# Patient Record
Sex: Female | Born: 1955 | Hispanic: No | Marital: Married | Smoking: Former smoker
Health system: Southern US, Community
[De-identification: ages and names within clinical notes are randomized; demographics above are authoritative.]

## PROBLEM LIST (undated history)

## (undated) ENCOUNTER — Emergency Department (HOSPITAL_COMMUNITY): Admission: EM | Payer: Self-pay | Source: Home / Self Care

## (undated) DIAGNOSIS — E0590 Thyrotoxicosis, unspecified without thyrotoxic crisis or storm: Secondary | ICD-10-CM

## (undated) DIAGNOSIS — J449 Chronic obstructive pulmonary disease, unspecified: Secondary | ICD-10-CM

## (undated) DIAGNOSIS — F419 Anxiety disorder, unspecified: Secondary | ICD-10-CM

## (undated) HISTORY — DX: Anxiety disorder, unspecified: F41.9

## (undated) HISTORY — PX: TUBAL LIGATION: SHX77

## (undated) HISTORY — PX: SKIN GRAFT: SHX250

---

## 2001-11-28 ENCOUNTER — Other Ambulatory Visit: Admission: RE | Admit: 2001-11-28 | Discharge: 2001-11-28 | Payer: Self-pay | Admitting: Dermatology

## 2021-03-29 ENCOUNTER — Other Ambulatory Visit: Payer: Self-pay

## 2021-03-29 ENCOUNTER — Encounter (HOSPITAL_COMMUNITY): Payer: Self-pay | Admitting: Emergency Medicine

## 2021-03-29 ENCOUNTER — Emergency Department (HOSPITAL_COMMUNITY): Payer: Medicare Other

## 2021-03-29 ENCOUNTER — Inpatient Hospital Stay (HOSPITAL_BASED_OUTPATIENT_CLINIC_OR_DEPARTMENT_OTHER): Payer: Medicare Other

## 2021-03-29 ENCOUNTER — Observation Stay (HOSPITAL_COMMUNITY)
Admission: EM | Admit: 2021-03-29 | Discharge: 2021-03-30 | Disposition: A | Discharge status: Home / Self Care | Payer: Medicare Other | Attending: Internal Medicine | Admitting: Internal Medicine

## 2021-03-29 DIAGNOSIS — J449 Chronic obstructive pulmonary disease, unspecified: Secondary | ICD-10-CM | POA: Diagnosis present

## 2021-03-29 DIAGNOSIS — Z20822 Contact with and (suspected) exposure to covid-19: Secondary | ICD-10-CM | POA: Diagnosis not present

## 2021-03-29 DIAGNOSIS — R0602 Shortness of breath: Secondary | ICD-10-CM | POA: Diagnosis not present

## 2021-03-29 DIAGNOSIS — E0590 Thyrotoxicosis, unspecified without thyrotoxic crisis or storm: Secondary | ICD-10-CM | POA: Diagnosis not present

## 2021-03-29 DIAGNOSIS — R1013 Epigastric pain: Secondary | ICD-10-CM | POA: Diagnosis not present

## 2021-03-29 DIAGNOSIS — Z87891 Personal history of nicotine dependence: Secondary | ICD-10-CM | POA: Insufficient documentation

## 2021-03-29 DIAGNOSIS — E039 Hypothyroidism, unspecified: Secondary | ICD-10-CM | POA: Diagnosis not present

## 2021-03-29 DIAGNOSIS — Z7901 Long term (current) use of anticoagulants: Secondary | ICD-10-CM | POA: Insufficient documentation

## 2021-03-29 DIAGNOSIS — R059 Cough, unspecified: Secondary | ICD-10-CM | POA: Insufficient documentation

## 2021-03-29 DIAGNOSIS — I4891 Unspecified atrial fibrillation: Secondary | ICD-10-CM

## 2021-03-29 DIAGNOSIS — Z720 Tobacco use: Secondary | ICD-10-CM

## 2021-03-29 DIAGNOSIS — R002 Palpitations: Secondary | ICD-10-CM | POA: Diagnosis present

## 2021-03-29 DIAGNOSIS — Z8719 Personal history of other diseases of the digestive system: Secondary | ICD-10-CM

## 2021-03-29 HISTORY — DX: Chronic obstructive pulmonary disease, unspecified: J44.9

## 2021-03-29 HISTORY — DX: Thyrotoxicosis, unspecified without thyrotoxic crisis or storm: E05.90

## 2021-03-29 LAB — CBC
HCT: 31.2 % — ABNORMAL LOW (ref 36.0–46.0)
Hemoglobin: 10.1 g/dL — ABNORMAL LOW (ref 12.0–15.0)
MCH: 28.9 pg (ref 26.0–34.0)
MCHC: 32.4 g/dL (ref 30.0–36.0)
MCV: 89.1 fL (ref 80.0–100.0)
Platelets: 250 10*3/uL (ref 150–400)
RBC: 3.5 MIL/uL — ABNORMAL LOW (ref 3.87–5.11)
RDW: 14.2 % (ref 11.5–15.5)
WBC: 5.5 10*3/uL (ref 4.0–10.5)
nRBC: 0 % (ref 0.0–0.2)

## 2021-03-29 LAB — ECHOCARDIOGRAM COMPLETE
AR max vel: 2.24 cm2
AV Area VTI: 1.97 cm2
AV Area mean vel: 2.03 cm2
AV Mean grad: 8 mmHg
AV Peak grad: 16.2 mmHg
Ao pk vel: 2.01 m/s
Area-P 1/2: 3.24 cm2
Calc EF: 63 %
Height: 63 in
MV VTI: 2.15 cm2
S' Lateral: 2.4 cm
Single Plane A2C EF: 72.1 %
Single Plane A4C EF: 57.5 %
Weight: 2130.53 oz

## 2021-03-29 LAB — MAGNESIUM: Magnesium: 1.8 mg/dL (ref 1.7–2.4)

## 2021-03-29 LAB — COMPREHENSIVE METABOLIC PANEL
ALT: 23 U/L (ref 0–44)
AST: 27 U/L (ref 15–41)
Albumin: 3.4 g/dL — ABNORMAL LOW (ref 3.5–5.0)
Alkaline Phosphatase: 171 U/L — ABNORMAL HIGH (ref 38–126)
Anion gap: 9 (ref 5–15)
BUN: 13 mg/dL (ref 8–23)
CO2: 24 mmol/L (ref 22–32)
Calcium: 9.3 mg/dL (ref 8.9–10.3)
Chloride: 107 mmol/L (ref 98–111)
Creatinine, Ser: 0.52 mg/dL (ref 0.44–1.00)
GFR, Estimated: >60 mL/min (ref >60)
Glucose, Bld: 142 mg/dL — ABNORMAL HIGH (ref 70–99)
Potassium: 3.5 mmol/L (ref 3.5–5.1)
Sodium: 140 mmol/L (ref 135–145)
Total Bilirubin: 0.7 mg/dL (ref 0.3–1.2)
Total Protein: 6.3 g/dL — ABNORMAL LOW (ref 6.5–8.1)

## 2021-03-29 LAB — RESP PANEL BY RT-PCR (FLU A&B, COVID) ARPGX2
Influenza A by PCR: NEGATIVE
Influenza B by PCR: NEGATIVE
SARS Coronavirus 2 by RT PCR: NEGATIVE

## 2021-03-29 LAB — CBG MONITORING, ED: Glucose-Capillary: 139 mg/dL — ABNORMAL HIGH (ref 70–99)

## 2021-03-29 LAB — T4, FREE: Free T4: 4.79 ng/dL — ABNORMAL HIGH (ref 0.61–1.12)

## 2021-03-29 LAB — BRAIN NATRIURETIC PEPTIDE: B Natriuretic Peptide: 628 pg/mL — ABNORMAL HIGH (ref 0.0–100.0)

## 2021-03-29 LAB — TROPONIN I (HIGH SENSITIVITY)
Troponin I (High Sensitivity): 12 ng/L (ref <18)
Troponin I (High Sensitivity): 12 ng/L (ref <18)

## 2021-03-29 LAB — MRSA NEXT GEN BY PCR, NASAL: MRSA by PCR Next Gen: NOT DETECTED

## 2021-03-29 LAB — TSH: TSH: <0.01 u[IU]/mL — ABNORMAL LOW (ref 0.350–4.500)

## 2021-03-29 MED ORDER — LACTATED RINGERS IV BOLUS
500.0000 mL | Freq: Once | INTRAVENOUS | Status: AC
  Administered 2021-03-29: 500 mL via INTRAVENOUS

## 2021-03-29 MED ORDER — APIXABAN 5 MG PO TABS
5.0000 mg | ORAL_TABLET | Freq: Two times a day (BID) | ORAL | Status: DC
  Administered 2021-03-29 – 2021-03-30 (×3): 5 mg via ORAL
  Filled 2021-03-29 (×3): qty 1

## 2021-03-29 MED ORDER — DILTIAZEM HCL-DEXTROSE 125-5 MG/125ML-% IV SOLN (PREMIX)
5.0000 mg/h | INTRAVENOUS | Status: DC
  Administered 2021-03-29: 5 mg/h via INTRAVENOUS
  Filled 2021-03-29: qty 125

## 2021-03-29 MED ORDER — CHLORHEXIDINE GLUCONATE CLOTH 2 % EX PADS
6.0000 | MEDICATED_PAD | Freq: Every day | CUTANEOUS | Status: DC
  Administered 2021-03-30: 6 via TOPICAL

## 2021-03-29 MED ORDER — POLYETHYLENE GLYCOL 3350 17 G PO PACK: 17.0000 g | PACK | Freq: Every day | ORAL | Status: DC | PRN

## 2021-03-29 MED ORDER — MAGNESIUM OXIDE -MG SUPPLEMENT 400 (240 MG) MG PO TABS
800.0000 mg | ORAL_TABLET | Freq: Once | ORAL | Status: AC
  Administered 2021-03-29: 800 mg via ORAL
  Filled 2021-03-29: qty 2

## 2021-03-29 MED ORDER — ASPIRIN 81 MG PO CHEW
324.0000 mg | CHEWABLE_TABLET | Freq: Once | ORAL | Status: AC
  Administered 2021-03-29: 324 mg via ORAL
  Filled 2021-03-29: qty 4

## 2021-03-29 MED ORDER — DOCUSATE SODIUM 100 MG PO CAPS: 100.0000 mg | ORAL_CAPSULE | Freq: Two times a day (BID) | ORAL | Status: DC | PRN

## 2021-03-29 MED ORDER — ALBUTEROL SULFATE (2.5 MG/3ML) 0.083% IN NEBU: 2.5000 mg | INHALATION_SOLUTION | RESPIRATORY_TRACT | Status: DC | PRN

## 2021-03-29 MED ORDER — DILTIAZEM HCL 60 MG PO TABS
60.0000 mg | ORAL_TABLET | Freq: Three times a day (TID) | ORAL | Status: DC
  Administered 2021-03-29 (×2): 60 mg via ORAL
  Filled 2021-03-29 (×2): qty 1

## 2021-03-29 MED ORDER — GUAIFENESIN-DM 100-10 MG/5ML PO SYRP
5.0000 mL | ORAL_SOLUTION | ORAL | Status: DC | PRN
  Filled 2021-03-29: qty 5

## 2021-03-29 MED ORDER — ALBUTEROL SULFATE HFA 108 (90 BASE) MCG/ACT IN AERS: 2.0000 | INHALATION_SPRAY | RESPIRATORY_TRACT | Status: DC | PRN

## 2021-03-29 MED ORDER — ACETAMINOPHEN 325 MG PO TABS: 650.0000 mg | ORAL_TABLET | ORAL | Status: DC | PRN

## 2021-03-29 MED ORDER — DILTIAZEM LOAD VIA INFUSION
10.0000 mg | Freq: Once | INTRAVENOUS | Status: AC
  Administered 2021-03-29: 10 mg via INTRAVENOUS
  Filled 2021-03-29: qty 10

## 2021-03-29 MED ORDER — POTASSIUM CHLORIDE CRYS ER 20 MEQ PO TBCR
40.0000 meq | EXTENDED_RELEASE_TABLET | Freq: Once | ORAL | Status: AC
Start: 2021-03-29 — End: 2021-03-29
  Administered 2021-03-29: 40 meq via ORAL
  Filled 2021-03-29: qty 2

## 2021-03-29 MED ORDER — ONDANSETRON HCL 4 MG/2ML IJ SOLN: 4.0000 mg | Freq: Four times a day (QID) | INTRAMUSCULAR | Status: DC | PRN

## 2021-03-29 NOTE — H&P (Signed)
History and Physical    Patient: Krista Ramirez MOL:[id number] DOB: Mar 22, 1955 DOA: 03/29/2021 DOS: the patient was seen and examined on 03/29/2021 PCP: Pcp, No  Patient coming from: Home  Chief Complaint:  Chief Complaint  Patient presents with   Shortness of Breath    HPI: Krista Ramirez is a 66 y.o. female with medical history significant of hypothyroidism, tobacco use recently quit smoking 5 weeks ago who presents with shortness of breath.  She states that since she quit smoking, she has had worsening productive cough.  She also admits to some epigastric pain with swallowing food.  She had an esophageal stricture decades ago which needed endoscopy and dilatation.  Otherwise denies any chest pain.  In the emergency department, she was found to be in A-fib RVR with heart rate in the 150s.  She was started on IV Cardizem and converted to normal sinus rhythm.  Review of Systems: As mentioned in the history of present illness. All other systems reviewed and are negative. Past Medical History:  Diagnosis Date   COPD (chronic obstructive pulmonary disease) (HCC)    Hyperthyroidism    Past Surgical History:  Procedure Laterality Date   SKIN GRAFT     TUBAL LIGATION     Social History:  reports that she quit smoking about 5 weeks ago. Her smoking use included cigarettes. She has a 30.00 pack-year smoking history. She has never used smokeless tobacco. She reports that she does not currently use alcohol. She reports that she does not currently use drugs.  No Known Allergies  History reviewed. No pertinent family history.  Prior to Admission medications   Medication Sig Start Date End Date Taking? Authorizing Provider  dextromethorphan-guaiFENesin (MUCINEX DM) 30-600 MG 12hr tablet Take 1 tablet by mouth 2 (two) times daily as needed for cough.   Yes [provider]    Physical Exam: Vitals:   03/29/21 1053 03/29/21 1100 03/29/21 1137 03/29/21 1141  BP:  122/68    Pulse:  79 73  65  Resp: (!) 23 (!) 22  (!) 26  Temp:      TempSrc:      SpO2: 100% 99%  99%  Weight:   60.4 kg   Height:   5\' 3"  (1.6 m)     Examination: General exam: Appears calm and comfortable  Respiratory system: Clear to auscultation. Respiratory effort normal. Cardiovascular system: S1 & S2 heard, RRR. No pedal edema. Gastrointestinal system: Abdomen is nondistended, soft and nontender. Normal bowel sounds heard. Central nervous system: Alert and oriented. Non focal exam. Speech clear  Extremities: Symmetric in appearance bilaterally  Skin: No rashes, lesions or ulcers on exposed skin  Psychiatry: Judgement and insight appear stable. Mood & affect appropriate.    Data Reviewed:  BMP largely unremarkable, BNP 628, troponin 12, TSH <0.010, influenza and COVID-negative  Assessment and Plan: * Atrial fibrillation with RVR (HCC)- (present on admission) Cardizem gtt Cardiology consulted Eliquis Echo ordered   Hyperthyroidism States she was started on methimazole about 3 years ago, stopped 6 months ago TSH <0.010 Check free T4  She will need to be restarted on methimazole, follow up with endocrinology   Tobacco abuse Congratulated on her quitting smoking 5 weeks ago       Advance Care Planning:   Code Status: Not on file full code  Consults: Cardiology  Family Communication: At bedside  Severity of Illness: The appropriate patient status for this patient is INPATIENT. Inpatient status is judged to be reasonable and necessary  in order to provide the required intensity of service to ensure the patient's safety. The patient's presenting symptoms, physical exam findings, and initial radiographic and laboratory data in the context of their chronic comorbidities is felt to place them at high Ramirez for further clinical deterioration. Furthermore, it is not anticipated that the patient will be medically stable for discharge from the hospital within 2 midnights of admission.   *  I certify that at the point of admission it is my clinical judgment that the patient will require inpatient hospital care spanning beyond 2 midnights from the point of admission due to high intensity of service, high Ramirez for further deterioration and high frequency of surveillance required.*  Author: Noralee Stain, DO 03/29/2021 11:44 AM  For on call review [email or web address].

## 2021-03-29 NOTE — Assessment & Plan Note (Addendum)
States she was started on methimazole about 3 years ago, stopped 6 months ago TSH <0.010 T4 4.79, T3 26.5 Started on methimazole She will need to follow-up with endocrinology referral

## 2021-03-29 NOTE — ED Provider Notes (Signed)
[facility] EMERGENCY DEPARTMENT Provider Note   CSN: [id number] Arrival date & time: 03/29/21  0741     History  Chief Complaint  Patient presents with   Shortness of Breath    Krista Ramirez is a 66 y.o. female.  HPI Patient presents for chest pain, palpitations, and shortness of breath.  She has no known medical problems.  Years ago, she had a thyroid disorder and previously took thyroid medication.  She no longer takes this.  She does not take any daily medications.  5 weeks ago she quit smoking.  Since that time, she has had a chronic cough that is worse at night.  Over the past week, she has experienced palpitations, shortness of breath, and exercise intolerance.  Over the past 4 days, she has experienced a substernal lower chest pain.  Chest pain is worsened with eating.  She has not noticed that the chest pain is worse with exertion but her shortness of breath certainly is.  She has no known cardiac history.  She denies nausea but does state that she has had some episodes of posttussive emesis. HPI: A 66 year old patient presents for evaluation of chest pain. Initial onset of pain was more than 6 hours ago. The patient's chest pain is described as heaviness/pressure/tightness and is worse with exertion. The patient's chest pain is not middle- or left-sided, is not well-localized, is not sharp and does not radiate to the arms/jaw/neck. The patient does not complain of nausea and denies diaphoresis. The patient has smoked in the past 90 days. The patient has no history of stroke, has no history of peripheral artery disease, denies any history of treated diabetes, has no relevant family history of coronary artery disease (first degree relative at less than age 11), is not hypertensive, has no history of hypercholesterolemia and does not have an elevated BMI (>=30).   Home Medications Prior to Admission medications   Medication Sig Start Date End Date Taking? Authorizing Provider   dextromethorphan-guaiFENesin (MUCINEX DM) 30-600 MG 12hr tablet Take 1 tablet by mouth 2 (two) times daily as needed for cough.   Yes [provider]      Allergies    Patient has no known allergies.    Review of Systems   Review of Systems  Constitutional:  Positive for activity change, appetite change and fatigue.  Respiratory:  Positive for cough and shortness of breath.   Cardiovascular:  Positive for chest pain and palpitations.  Gastrointestinal:  Positive for vomiting (posttussive).  All other systems reviewed and are negative.  Physical Exam Updated Vital Signs BP (!) 110/54    Pulse (!) 55    Temp 98 F (36.7 C) (Oral)    Resp 20    Ht 5\' 3"  (1.6 m)    Wt 60.4 kg    SpO2 94%    BMI 23.59 kg/m  Physical Exam Vitals and nursing note reviewed.  Constitutional:      General: She is not in acute distress.    Appearance: She is well-developed and normal weight. She is not ill-appearing, toxic-appearing or diaphoretic.  HENT:     Head: Normocephalic and atraumatic.     Mouth/Throat:     Mouth: Mucous membranes are moist.     Pharynx: Oropharynx is clear.  Eyes:     Extraocular Movements: Extraocular movements intact.     Conjunctiva/sclera: Conjunctivae normal.  Neck:     Vascular: JVD present.  Cardiovascular:     Rate and Rhythm: Tachycardia present. Rhythm  irregular.     Heart sounds: No murmur heard. Pulmonary:     Effort: Pulmonary effort is normal. No respiratory distress.     Breath sounds: Normal breath sounds. No decreased breath sounds, wheezing, rhonchi or rales.  Chest:     Chest wall: No mass or tenderness.  Abdominal:     Palpations: Abdomen is soft.     Tenderness: There is no abdominal tenderness.  Musculoskeletal:        General: No swelling.     Cervical back: Normal range of motion and neck supple.     Right lower leg: No edema.     Left lower leg: No edema.  Skin:    General: Skin is warm and dry.     Capillary Refill: Capillary  refill takes less than 2 seconds.     Coloration: Skin is not cyanotic or pale.  Neurological:     General: No focal deficit present.     Mental Status: She is alert and oriented to person, place, and time.     Cranial Nerves: No cranial nerve deficit.     Motor: No weakness.  Psychiatric:        Mood and Affect: Mood is anxious.        Behavior: Behavior normal. Behavior is not agitated.    ED Results / Procedures / Treatments   Labs (all labs ordered are listed, but only abnormal results are displayed) Labs Reviewed  CBC - Abnormal; Notable for the following components:      Result Value   RBC 3.50 (*)    Hemoglobin 10.1 (*)    HCT 31.2 (*)    All other components within normal limits  COMPREHENSIVE METABOLIC PANEL - Abnormal; Notable for the following components:   Glucose, Bld 142 (*)    Total Protein 6.3 (*)    Albumin 3.4 (*)    Alkaline Phosphatase 171 (*)    All other components within normal limits  TSH - Abnormal; Notable for the following components:   TSH <0.010 (*)    All other components within normal limits  BRAIN NATRIURETIC PEPTIDE - Abnormal; Notable for the following components:   B Natriuretic Peptide 628.0 (*)    All other components within normal limits  CBG MONITORING, ED - Abnormal; Notable for the following components:   Glucose-Capillary 139 (*)    All other components within normal limits  RESP PANEL BY RT-PCR (FLU A&B, COVID) ARPGX2  MRSA NEXT GEN BY PCR, NASAL  MAGNESIUM  T4, FREE  T3, FREE  HIV ANTIBODY (ROUTINE TESTING W REFLEX)  CBC  BASIC METABOLIC PANEL  MAGNESIUM  TROPONIN I (HIGH SENSITIVITY)  TROPONIN I (HIGH SENSITIVITY)    EKG EKG Interpretation  Date/Time:  Monday March 29 2021 08:31:15 EST Ventricular Rate:  136 PR Interval:    QRS Duration: 88 QT Interval:  298 QTC Calculation: 465 R Axis:   45 Text Interpretation: Atrial fibrillation Ventricular premature complex Aberrant conduction of SV complex(es) Confirmed  by Godfrey Pick ([id number]) on 03/29/2021 9:12:55 AM  Radiology DG Chest Port 1 View  Result Date: 03/29/2021 CLINICAL DATA:  Shortness of breath, new onset atrial fibrillation EXAM: PORTABLE CHEST 1 VIEW COMPARISON:  Portable exam 0845 hours without priors for comparison FINDINGS: Upper normal heart size. Mediastinal contours and pulmonary vascularity normal. Atherosclerotic calcification aorta. Lungs clear. No infiltrate, pleural effusion, or pneumothorax. Osseous structures unremarkable. IMPRESSION: No acute abnormalities. Aortic Atherosclerosis (ICD10-I70.0). Electronically Signed   By: Crist Infante.D.  On: 03/29/2021 08:49   ECHOCARDIOGRAM COMPLETE  Result Date: 03/29/2021    ECHOCARDIOGRAM REPORT   Patient Name:   AMARYLIS DONAN Date of Exam: 03/29/2021 Medical Rec #:  [id number]      Height:       63.0 in Accession #:    [id number]     Weight:       133.2 lb Date of Birth:  April 09, 1955      BSA:          1.627 m Patient Age:    65 years       BP:           122/68 mmHg Patient Gender: F              HR:           70 bpm. [facility] Procedure: 2D Echo, Cardiac Doppler and Color Doppler Indications:    Atrial fibrillation  History:        Patient has no prior history of Echocardiogram examinations.                 COPD; Arrythmias:Atrial Fibrillation.  Sonographer:    Mikki Harbor Referring Phys: [id number] MICHELE M LENZE IMPRESSIONS  1. Left ventricular ejection fraction, by estimation, is 60 to 65%. The left ventricle has normal function. The left ventricle has no regional wall motion abnormalities. Left ventricular diastolic parameters are indeterminate.  2. Right ventricular systolic function is normal. The right ventricular size is normal. There is normal pulmonary artery systolic pressure.  3. Left atrial size was mildly dilated.  4. The mitral valve is normal in structure. Mild mitral valve regurgitation. No evidence of mitral stenosis.  5. The aortic valve is tricuspid. Aortic valve  regurgitation is mild. No aortic stenosis is present.  6. The inferior vena cava is normal in size with greater than 50% respiratory variability, suggesting right atrial pressure of 3 mmHg. FINDINGS  Left Ventricle: Left ventricular ejection fraction, by estimation, is 60 to 65%. The left ventricle has normal function. The left ventricle has no regional wall motion abnormalities. The left ventricular internal cavity size was normal in size. There is  no left ventricular hypertrophy. Left ventricular diastolic parameters are indeterminate. Right Ventricle: The right ventricular size is normal. No increase in right ventricular wall thickness. Right ventricular systolic function is normal. There is normal pulmonary artery systolic pressure. The tricuspid regurgitant velocity is 2.55 m/s, and  with an assumed right atrial pressure of 3 mmHg, the estimated right ventricular systolic pressure is 29.0 mmHg. Left Atrium: Left atrial size was mildly dilated. Right Atrium: Right atrial size was normal in size. Pericardium: There is no evidence of pericardial effusion. Mitral Valve: The mitral valve is normal in structure. Mild mitral valve regurgitation. No evidence of mitral valve stenosis. MV peak gradient, 6.0 mmHg. The mean mitral valve gradient is 2.0 mmHg. Tricuspid Valve: The tricuspid valve is normal in structure. Tricuspid valve regurgitation is mild . No evidence of tricuspid stenosis. Aortic Valve: The aortic valve is tricuspid. Aortic valve regurgitation is mild. No aortic stenosis is present. Aortic valve mean gradient measures 8.0 mmHg. Aortic valve peak gradient measures 16.2 mmHg. Aortic valve area, by VTI measures 1.97 cm. Pulmonic Valve: The pulmonic valve was not well visualized. Pulmonic valve regurgitation is mild. No evidence of pulmonic stenosis. Aorta: The aortic root is normal in size and structure. Venous: The inferior vena cava is normal in size with greater than 50% respiratory variability,  suggesting right atrial pressure of 3 mmHg. IAS/Shunts: No atrial level shunt detected by color flow Doppler.  LEFT VENTRICLE PLAX 2D LVIDd:         4.30 cm     Diastology LVIDs:         2.40 cm     LV e' medial:    7.18 cm/s LV PW:         1.00 cm     LV E/e' medial:  17.4 LV IVS:        1.00 cm     LV e' lateral:   10.60 cm/s LVOT diam:     2.00 cm     LV E/e' lateral: 11.8 LV SV:         82 LV SV Index:   50 LVOT Area:     3.14 cm  LV Volumes (MOD) LV vol d, MOD A2C: 70.5 ml LV vol d, MOD A4C: 67.8 ml LV vol s, MOD A2C: 19.7 ml LV vol s, MOD A4C: 28.8 ml LV SV MOD A2C:     50.8 ml LV SV MOD A4C:     67.8 ml LV SV MOD BP:      43.9 ml RIGHT VENTRICLE RV Basal diam:  3.50 cm RV Mid diam:    2.80 cm RV S prime:     14.80 cm/s TAPSE (M-mode): 3.3 cm LEFT ATRIUM             Index        RIGHT ATRIUM           Index LA diam:        3.60 cm 2.21 cm/m   RA Area:     21.20 cm LA Vol (A2C):   56.6 ml 34.80 ml/m  RA Volume:   65.00 ml  39.96 ml/m LA Vol (A4C):   65.9 ml 40.51 ml/m LA Biplane Vol: 64.6 ml 39.71 ml/m  AORTIC VALVE                     PULMONIC VALVE AV Area (Vmax):    2.24 cm      PV Vmax:       0.95 m/s AV Area (Vmean):   2.03 cm      PV Peak grad:  3.6 mmHg AV Area (VTI):     1.97 cm AV Vmax:           201.00 cm/s AV Vmean:          128.000 cm/s AV VTI:            0.414 m AV Peak Grad:      16.2 mmHg AV Mean Grad:      8.0 mmHg LVOT Vmax:         143.00 cm/s LVOT Vmean:        82.600 cm/s LVOT VTI:          0.260 m LVOT/AV VTI ratio: 0.63  AORTA Ao Root diam: 2.80 cm MITRAL VALVE                TRICUSPID VALVE MV Area (PHT): 3.24 cm     TR Peak grad:   26.0 mmHg MV Area VTI:   2.15 cm     TR Vmax:        255.00 cm/s MV Peak grad:  6.0 mmHg MV Mean grad:  2.0 mmHg     SHUNTS MV Vmax:       1.22 m/s  Systemic VTI:  0.26 m MV Vmean:      72.2 cm/s    Systemic Diam: 2.00 cm MV Decel Time: 234 msec MV E velocity: 125.00 cm/s MV A velocity: 82.90 cm/s MV E/A ratio:  1.51 Carlyle Dolly MD  Electronically signed by Carlyle Dolly MD Signature Date/Time: 03/29/2021/1:12:32 PM    Final     Procedures Procedures    Medications Ordered in ED Medications  diltiazem (CARDIZEM) 1 mg/mL load via infusion 10 mg (10 mg Intravenous Bolus from Bag 03/29/21 0856)    And  diltiazem (CARDIZEM) 125 mg in dextrose 5% 125 mL (1 mg/mL) infusion (5 mg/hr Intravenous Infusion Verify 03/29/21 1541)  apixaban (ELIQUIS) tablet 5 mg (5 mg Oral Given 03/29/21 0902)  albuterol (PROVENTIL) (2.5 MG/3ML) 0.083% nebulizer solution 2.5 mg (has no administration in time range)  Chlorhexidine Gluconate Cloth 2 % PADS 6 each (has no administration in time range)  docusate sodium (COLACE) capsule 100 mg (has no administration in time range)  polyethylene glycol (MIRALAX / GLYCOLAX) packet 17 g (has no administration in time range)  acetaminophen (TYLENOL) tablet 650 mg (has no administration in time range)  ondansetron (ZOFRAN) injection 4 mg (has no administration in time range)  diltiazem (CARDIZEM) tablet 60 mg (60 mg Oral Given 03/29/21 1300)  aspirin chewable tablet 324 mg (324 mg Oral Given 03/29/21 0825)  lactated ringers bolus 500 mL (0 mLs Intravenous Stopped 03/29/21 0915)  magnesium oxide (MAG-OX) tablet 800 mg (800 mg Oral Given 03/29/21 0923)  potassium chloride SA (KLOR-CON M) CR tablet 40 mEq (40 mEq Oral Given 03/29/21 [id number])    ED Course/ Medical Decision Making/ A&P   HEAR Score: 5                       Medical Decision Making Amount and/or Complexity of Data Reviewed Labs: ordered.  Risk OTC drugs. Prescription drug management. Decision regarding hospitalization.   This patient presents to the ED for concern of chest pain, palpitations, and shortness of breath, this involves an extensive number of treatment options, and is a complaint that carries with it a high risk of complications and morbidity.  The differential diagnosis includes atrial fibrillation, atrial flutter, CHF, ACS,  pneumonia, PE   Co morbidities that complicate the patient evaluation  Unknown   Additional history obtained:  Additional history obtained from patient's significant other External records from outside source obtained and reviewed including EMR   Lab Tests:  I Ordered, and personally interpreted labs.  The pertinent results include: Borderline low potassium and magnesium, normocytic anemia with unknown baseline hemoglobin, elevated BNP, normal troponin.   Imaging Studies ordered:  I ordered imaging studies including chest x-ray I independently visualized and interpreted imaging which showed no acute findings I agree with the radiologist interpretation   Cardiac Monitoring:  The patient was maintained on a cardiac monitor.  I personally viewed and interpreted the cardiac monitored which showed an underlying rhythm of: Atrial fibrillation   Medicines ordered and prescription drug management:  I ordered medication including Cardizem for rate control Reevaluation of the patient after these medicines showed that the patient improved I have reviewed the patients home medicines and have made adjustments as needed   Test Considered:  CTA chest, patient could have chest pain or shortness of breath from a PE, however, does appear that symptoms are related to her atrial fibrillation.   Critical Interventions:  Cardizem for rate control, initiation of Eliquis, admission to hospital  Consultations Obtained:  I requested consultation with the cardiology,  and discussed lab and imaging findings as well as pertinent plan - they recommend: Admission.  They will follow in consult.   Problem List / ED Course:  Very pleasant 66 year old female who does not have any known chronic medical conditions, but also does not see outpatient doctors.  She presents to the emergency department for 4 to 7 days of palpitations, shortness of breath, exercise intolerance, and intermittent  substernal chest pain.  On arrival in the ED, she is found to be in atrial fibrillation with RVR.  This will be a new diagnosis.  Lab work was obtained to identify underlying causes of her new A-fib.  Patient had grossly normal LVEF on bedside ultrasound.  Cardizem bolus and gtt. were ordered.  Lab work showed potassium and magnesium levels that were at the low end of normal.  Replacement electrolytes were given for optimization.  Patient was started on Eliquis.  On reassessment, patient had improved heart rate and subjectively improved symptoms following 10 mg bolus of Cardizem and a gtt running at 5 mg/h.  I did speak with cardiology who agrees with current management and recommends admission.  They will follow in consultation.  Based on paucity of previous testing, in addition to her new to atrial fibrillation, I do feel the patient would benefit from admission to hospital for continued rate control, monitoring, and further testing.     Reevaluation:  After the interventions noted above, I reevaluated the patient and found that they have :improved   Social Determinants of Health:  Does not see doctors   Dispostion:  After consideration of the diagnostic results and the patients response to treatment, I feel that the patent would benefit from admission.   CRITICAL CARE Performed by: Godfrey Pick   Total critical care time: 35 minutes  Critical care time was exclusive of separately billable procedures and treating other patients.  Critical care was necessary to treat or prevent imminent or life-threatening deterioration.  Critical care was time spent personally by me on the following activities: development of treatment plan with patient and/or surrogate as well as nursing, discussions with consultants, evaluation of patient's response to treatment, examination of patient, obtaining history from patient or surrogate, ordering and performing treatments and interventions, ordering and review  of laboratory studies, ordering and review of radiographic studies, pulse oximetry and re-evaluation of patient's condition.         Final Clinical Impression(s) / ED Diagnoses Final diagnoses:  Atrial fibrillation with RVR [facility])    Rx / DC Orders ED Discharge Orders          Ordered    Amb referral to AFIB Clinic        03/29/21 0835              Godfrey Pick, MD 03/29/21 1758

## 2021-03-29 NOTE — ED Triage Notes (Signed)
Pt states she quit smoking 5 wks ago and has increased shortness of breath with coughing.

## 2021-03-29 NOTE — Assessment & Plan Note (Signed)
Congratulated on her quitting smoking 5 weeks ago

## 2021-03-29 NOTE — Consult Note (Addendum)
Cardiology Consultation:   Patient ID: Saul Conrado MRN: [id number]; DOB: 17-Aug-1955  Admit date: 03/29/2021 Date of Consult: 03/29/2021  PCP:  Merryl Hacker, No   [facility] Providers Cardiologist:  None        Patient Profile:   Jasiri Sandin is a 66 y.o. female with a hx of hyperthyroidism who is being seen 03/29/2021 for the evaluation of new Afib at the request of Dr. Maylene Roes.  History of Present Illness:   Ms. Highsmith is a 66 yo female with history of hyperthyroidism off meds for 6 months. She quit smoking 5 weeks ago cold turkey(1ppd) and developed a terrible cough, palpitations, dyspnea with little activity, chest pressure with activity and fast heart rates. On arrival noted to be in Afib with RVR and now on IV diltiazem and eliquis. TSH low 0.010, BNP 628, Troponin normal, Hgb 10.1.  Denies any history of HTN, DM, family history of CAD.   Past Medical History:  Diagnosis Date   COPD (chronic obstructive pulmonary disease) ([place])    Hypothyroid     Past Surgical History:  Procedure Laterality Date   SKIN GRAFT     TUBAL LIGATION       Home Medications:  Prior to Admission medications   Not on File    Inpatient Medications: Scheduled Meds:  apixaban  5 mg Oral BID   Continuous Infusions:  diltiazem (CARDIZEM) infusion 5 mg/hr (03/29/21 0856)   PRN Meds: albuterol  Allergies:   No Known Allergies  Social History:   Social History   Socioeconomic History   Marital status: Married    Spouse name: Not on file   Number of children: Not on file   Years of education: Not on file   Highest education level: Not on file  Occupational History   Not on file  Tobacco Use   Smoking status: Former    Packs/day: 1.00    Years: 30.00    Pack years: 30.00    Types: Cigarettes    Quit date: 02/22/2021    Years since quitting: 0.0   Smokeless tobacco: Never  Vaping Use   Vaping Use: Never used  Substance and Sexual Activity   Alcohol use: Not Currently   Drug use:  Not Currently   Sexual activity: Not on file  Other Topics Concern   Not on file  Social History Narrative   Not on file   Social Determinants of Health   Financial Resource Strain: Not on file  Food Insecurity: Not on file  Transportation Needs: Not on file  Physical Activity: Not on file  Stress: Not on file  Social Connections: Not on file  Intimate Partner Violence: Not on file    Family History:    History reviewed. No pertinent family history.   ROS:  Please see the history of present illness.  Review of Systems  Constitutional: Negative.  HENT: Negative.    Eyes: Negative.   Cardiovascular:  Positive for chest pain, dyspnea on exertion, irregular heartbeat and palpitations.  Respiratory:  Positive for cough.   Hematologic/Lymphatic: Negative.   Musculoskeletal: Negative.  Negative for joint pain.  Gastrointestinal: Negative.   Genitourinary: Negative.   Neurological: Negative.    All other ROS reviewed and negative.     Physical Exam/Data:   Vitals:   03/29/21 0910 03/29/21 0920 03/29/21 0932 03/29/21 0945  BP: (!) 150/81 (!) 142/78    Pulse: 98 68 86 96  Resp: 16 (!) 22 14 (!) 22  Temp:  TempSrc:      SpO2: 98% 100% 97% 100%  Weight:      Height:        Intake/Output Summary (Last 24 hours) at 03/29/2021 1006 Last data filed at 03/29/2021 0915 Gross per 24 hour  Intake 250 ml  Output --  Net 250 ml   Last 3 Weights 03/29/2021  Weight (lbs) 140 lb  Weight (kg) 63.504 kg     Body mass index is 24.8 kg/m.  General:  Well nourished, well developed, in no acute distress  HEENT: normal Neck: no JVD Vascular: No carotid bruits; Distal pulses 2+ bilaterally Cardiac:  normal S1, S2; irreg irreg no murmur   Lungs:  decreased breath sounds but clear to auscultation bilaterally, no wheezing, rhonchi or rales  Abd: soft, nontender, no hepatomegaly  Ext: no edema Musculoskeletal:  No deformities, BUE and BLE strength normal and equal Skin: warm and  dry  Neuro:  CNs 2-12 intact, no focal abnormalities noted Psych:  Normal affect   EKG:  The EKG was personally reviewed and demonstrates:  Afib with RVR Telemetry:  Telemetry was personally reviewed and demonstrates:  Afib 105/m  Relevant CV Studies:    Laboratory Data:  High Sensitivity Troponin:   Recent Labs  Lab 03/29/21 0815  TROPONINIHS 12     Chemistry Recent Labs  Lab 03/29/21 0815  NA 140  K 3.5  CL 107  CO2 24  GLUCOSE 142*  BUN 13  CREATININE 0.52  CALCIUM 9.3  MG 1.8  GFRNONAA >60  ANIONGAP 9    Recent Labs  Lab 03/29/21 0815  PROT 6.3*  ALBUMIN 3.4*  AST 27  ALT 23  ALKPHOS 171*  BILITOT 0.7   Lipids No results for input(s): CHOL, TRIG, HDL, LABVLDL, LDLCALC, CHOLHDL in the last 168 hours.  Hematology Recent Labs  Lab 03/29/21 0815  WBC 5.5  RBC 3.50*  HGB 10.1*  HCT 31.2*  MCV 89.1  MCH 28.9  MCHC 32.4  RDW 14.2  PLT 250   Thyroid  Recent Labs  Lab 03/29/21 0816  TSH <0.010*    BNP Recent Labs  Lab 03/29/21 0817  BNP 628.0*    DDimer No results for input(s): DDIMER in the last 168 hours.   Radiology/Studies:  DG Chest Port 1 View  Result Date: 03/29/2021 CLINICAL DATA:  Shortness of breath, new onset atrial fibrillation EXAM: PORTABLE CHEST 1 VIEW COMPARISON:  Portable exam 0845 hours without priors for comparison FINDINGS: Upper normal heart size. Mediastinal contours and pulmonary vascularity normal. Atherosclerotic calcification aorta. Lungs clear. No infiltrate, pleural effusion, or pneumothorax. Osseous structures unremarkable. IMPRESSION: No acute abnormalities. Aortic Atherosclerosis (ICD10-I70.0). Electronically Signed   By: Lavonia Dana M.D.   On: 03/29/2021 08:49     Assessment and Plan:   Afib with RVR new onset on IV diltiazem and eliquis. Will need endocrine to see with hyperthyroidism untreated. Hopefully will convert but if not can plan DCCV after 3 weeks of eliquis and treated thyroid. CHADSVASC=2 for  age/sex. Will await echo.   Chest pain, palpitations dyspnea in the setting of rapid Afib troponin negative-check echo   Hyperthyroidism-off meds for 6 months, low TSH needs endocrine to see. Previously on methimazole   Tobacco abuse-quit smoking 5 weeks ago   Risk Assessment/Risk Scores:     HEAR Score (for undifferentiated chest pain):  HEAR Score: 5  New York Heart Association (NYHA) Functional Class NYHA Class II  CHA2DS2-VASc Score = 2   This indicates a  2.2% annual risk of stroke. The patient's score is based upon: CHF History: 0 HTN History: 0 Diabetes History: 0 Stroke History: 0 Vascular Disease History: 0 Age Score: 1 Gender Score: 1          For questions or updates, please contact [place] Please consult [email or web address] for contact info under    Signed, Ermalinda Barrios, PA-C  03/29/2021 10:06 AM  Attending note  Patient seen and discussed with PA Bonnell Public, I agree with her documentation. No known medical problems presents with palpitaitons, SOB, chest pain. In ER found to be in afib with RVR, new diagnosis for the patient. Started on diltiazem drip with resolution of symptoms with heart rate control.    WBC 5.5 Hgb 10.1 Plt 250 Mg 1.8 K 3.5 Cr 0.52 TSH <0.010 BNP 628 Trop 12 CXR no acute process EKG afib with RVR  1.Afib w/ RVR - new diagnosis this admission - started on dilt gtt, she has already converted back to NSR - start oral diltiazem 60mg  tid with hold parameters, wean dilt gtt to off - CHADS2Vasc score is at least 2(age, gender), started on eliquis 5mg  bid   2.Hyperthyroidism - significantly low TSH, free T4 pending - management per primary team   Carlyle Dolly MD

## 2021-03-29 NOTE — Assessment & Plan Note (Addendum)
Cardizem gtt --> p.o. Cardizem Appreciate cardiology, follow-up with them outpatient Eliquis

## 2021-03-29 NOTE — Progress Notes (Signed)
*  PRELIMINARY RESULTS* Echocardiogram 2D Echocardiogram has been performed.  Carolyne Fiscal 03/29/2021, 12:39 PM

## 2021-03-29 NOTE — Discharge Instructions (Signed)

## 2021-03-29 NOTE — ED Notes (Addendum)
Note in error.

## 2021-03-30 DIAGNOSIS — I4891 Unspecified atrial fibrillation: Secondary | ICD-10-CM | POA: Diagnosis not present

## 2021-03-30 DIAGNOSIS — Z8719 Personal history of other diseases of the digestive system: Secondary | ICD-10-CM

## 2021-03-30 DIAGNOSIS — E0590 Thyrotoxicosis, unspecified without thyrotoxic crisis or storm: Secondary | ICD-10-CM | POA: Diagnosis not present

## 2021-03-30 LAB — CBC
HCT: 29 % — ABNORMAL LOW (ref 36.0–46.0)
Hemoglobin: 8.8 g/dL — ABNORMAL LOW (ref 12.0–15.0)
MCH: 26.9 pg (ref 26.0–34.0)
MCHC: 30.3 g/dL (ref 30.0–36.0)
MCV: 88.7 fL (ref 80.0–100.0)
Platelets: 224 10*3/uL (ref 150–400)
RBC: 3.27 MIL/uL — ABNORMAL LOW (ref 3.87–5.11)
RDW: 14.2 % (ref 11.5–15.5)
WBC: 4.6 10*3/uL (ref 4.0–10.5)
nRBC: 0 % (ref 0.0–0.2)

## 2021-03-30 LAB — T3, FREE: T3, Free: 26.5 pg/mL — ABNORMAL HIGH (ref 2.0–4.4)

## 2021-03-30 LAB — BASIC METABOLIC PANEL
Anion gap: 9 (ref 5–15)
BUN: 28 mg/dL — ABNORMAL HIGH (ref 8–23)
CO2: 21 mmol/L — ABNORMAL LOW (ref 22–32)
Calcium: 9.3 mg/dL (ref 8.9–10.3)
Chloride: 110 mmol/L (ref 98–111)
Creatinine, Ser: 0.56 mg/dL (ref 0.44–1.00)
GFR, Estimated: >60 mL/min (ref >60)
Glucose, Bld: 105 mg/dL — ABNORMAL HIGH (ref 70–99)
Potassium: 4.4 mmol/L (ref 3.5–5.1)
Sodium: 140 mmol/L (ref 135–145)

## 2021-03-30 LAB — MAGNESIUM: Magnesium: 2 mg/dL (ref 1.7–2.4)

## 2021-03-30 LAB — HIV ANTIBODY (ROUTINE TESTING W REFLEX): HIV Screen 4th Generation wRfx: NONREACTIVE

## 2021-03-30 MED ORDER — METHIMAZOLE 10 MG PO TABS: 10.0000 mg | ORAL_TABLET | Freq: Two times a day (BID) | ORAL | 1 refills | Status: DC

## 2021-03-30 MED ORDER — METHIMAZOLE 5 MG PO TABS
10.0000 mg | ORAL_TABLET | Freq: Two times a day (BID) | ORAL | Status: DC
  Filled 2021-03-30 (×3): qty 2

## 2021-03-30 MED ORDER — DILTIAZEM HCL ER COATED BEADS 120 MG PO CP24: 120.0000 mg | ORAL_CAPSULE | Freq: Every day | ORAL | 1 refills | Status: DC

## 2021-03-30 MED ORDER — DILTIAZEM HCL ER COATED BEADS 120 MG PO CP24
120.0000 mg | ORAL_CAPSULE | Freq: Every day | ORAL | Status: DC
  Administered 2021-03-30: 120 mg via ORAL
  Filled 2021-03-30: qty 1

## 2021-03-30 MED ORDER — APIXABAN 5 MG PO TABS: 5.0000 mg | ORAL_TABLET | Freq: Two times a day (BID) | ORAL | 1 refills | Status: DC

## 2021-03-30 NOTE — Assessment & Plan Note (Signed)
States that she underwent EGD and dilatation many years ago, now having symptoms of food getting stuck in her chest She will need outpatient GI referral

## 2021-03-30 NOTE — Hospital Course (Signed)
Krista Ramirez is a 66 y.o. female with medical history significant of hypothyroidism, tobacco use recently quit smoking 5 weeks ago who presents with shortness of breath.  She states that since she quit smoking, she has had worsening productive cough.  She also admits to some epigastric pain with swallowing food.  She had an esophageal stricture decades ago which needed endoscopy and dilatation.  Otherwise denies any chest pain.   In the emergency department, she was found to be in A-fib RVR with heart rate in the 150s.  She was started on IV Cardizem and converted to normal sinus rhythm.  Patient was seen by cardiology, underwent echocardiogram. Her work up also revealed hyperthyroidism (states she stopped taking methimazole 6 months ago).  She remained stable on oral Cardizem and was discharged home in stable condition.

## 2021-03-30 NOTE — Care Management Obs Status (Signed)
MEDICARE OBSERVATION STATUS NOTIFICATION   Patient Details  Name: Krista Ramirez MRN: [id number] Date of Birth: 1955/07/12   Medicare Observation Status Notification Given:  Yes    Elliot Gault, LCSW 03/30/2021, 9:12 AM

## 2021-03-30 NOTE — TOC Transition Note (Addendum)
Transition of Care [facility]) - CM/SW Discharge Note   Patient Details  Name: Krista Ramirez MRN: [id number] Date of Birth: Dec 31, 1955  [facility] CM/SW Contact:  Shade Flood, LCSW Phone Number: 03/30/2021, 9:13 AM   Clinical Narrative:     Pt stable for dc today per MD. Met with pt to review [facility] consult for PCP and Medication assistance and PCP. Pt reports that she just recently became active with Medicare and she does not have Part D prescription assistance. She also does not have a PCP. Pt's husband is in the room and on the phone with the office of his PCP trying to get an appointment for pt. Pt is also now scheduled for follow up with a cardiologist. Pt is not eligible for any prescription assistance as she does have Medicare and elected not to enroll in Part D. Informed pt that they can ask at the pharmacy if there are any more of the 30 day free Eliquis coupons available. Pt informed that [facility] does not have any more available and TOC unsure if the program is still available or not. Pt and her husband state that they will work to find the prescriptions at the most affordable price.   At this time, there are no other TOC needs for dc.  1051: [facility] Pharmacist did have the 30 day free coupon for Eliquis and this was provided to pt by pharmacist.   Expected Discharge Plan: Home/Self Care Barriers to Discharge: Barriers Resolved   Patient Goals and CMS Choice Patient states their goals for this hospitalization and ongoing recovery are:: go home      Expected Discharge Plan and Services Expected Discharge Plan: Home/Self Care In-house Referral: Clinical Social Work     Living arrangements for the past 2 months: Single Family Home Expected Discharge Date: 03/30/21                                    Prior Living Arrangements/Services Living arrangements for the past 2 months: Single Family Home Lives with:: Spouse Patient language and need for  interpreter reviewed:: Yes Do you feel safe going back to the place where you live?: Yes      Need for Family Participation in Patient Care: No (Comment)     Criminal Activity/Legal Involvement Pertinent to Current Situation/Hospitalization: No - Comment as needed  Activities of Daily Living Home Assistive Devices/Equipment: None ADL Screening (condition at time of admission) Patient's cognitive ability adequate to safely complete daily activities?: Yes Is the patient deaf or have difficulty hearing?: No Does the patient have difficulty seeing, even when wearing glasses/contacts?: No Does the patient have difficulty concentrating, remembering, or making decisions?: No Patient able to express need for assistance with ADLs?: Yes Does the patient have difficulty dressing or bathing?: No Independently performs ADLs?: Yes (appropriate for developmental age) Does the patient have difficulty walking or climbing stairs?: No Weakness of Legs: None Weakness of Arms/Hands: None  Permission Sought/Granted                  Emotional Assessment Appearance:: Appears stated age Attitude/Demeanor/Rapport: Engaged Affect (typically observed): Pleasant Orientation: : Oriented to Self, Oriented to Place, Oriented to  Time, Oriented to Situation Alcohol / Substance Use: Not Applicable Psych Involvement: No (comment)  Admission diagnosis:  SOB (shortness of breath) [R06.02] Atrial fibrillation with RVR ([place]) [I48.91] Patient Active Problem List   Diagnosis Date  Noted   History of esophageal stricture 03/30/2021   Atrial fibrillation with RVR ([place]) 03/29/2021   Tobacco abuse 03/29/2021   COPD (chronic obstructive pulmonary disease) ([place])    Hyperthyroidism    PCP:  Pcp, No Pharmacy:   [facility] [place] [place] - [address] [place] [address] Phone: [phone] Fax: [phone]     Social Determinants of Health (SDOH) Interventions    Readmission Risk  Interventions No flowsheet data found.   Final next level of care: Home/Self Care Barriers to Discharge: Barriers Resolved   Patient Goals and CMS Choice Patient states their goals for this hospitalization and ongoing recovery are:: go home      Discharge Placement                       Discharge Plan and Services In-house Referral: Clinical Social Work                                   Social Determinants of Health (SDOH) Interventions     Readmission Risk Interventions No flowsheet data found.

## 2021-03-30 NOTE — Care Management CC44 (Signed)
Condition Code 44 Documentation Completed  Patient Details  Name: Krista Ramirez MRN: [id number] Date of Birth: May 26, 1955   Condition Code 44 given:  Yes Patient signature on Condition Code 44 notice:  Yes Documentation of 2 MD's agreement:  Yes Code 44 added to claim:  Yes    Shade Flood, LCSW 03/30/2021, 9:12 AM

## 2021-03-30 NOTE — Progress Notes (Signed)
D/c paperwork & instructions given to patient, IV catheter removed from RIGHT arm, patient changed & reported she has all of her belongings

## 2021-03-30 NOTE — Discharge Summary (Signed)
Physician Discharge Summary   Patient: Krista Ramirez MRN: [id number] DOB: Jan 28, 1956  Admit date:     03/29/2021  Discharge date: 03/30/21  Discharge Physician: Dessa Phi   PCP: Pcp, No   Recommendations at discharge:   Encouraged patient to establish with PCP as soon as possible. She will need endocrinology referral for hyperthyroidism and GI referral for history of esophageal stricture.  Follow up with Dr. Harl Bowie, cardiology  Discharge Diagnoses: Principal Problem:   Atrial fibrillation with RVR ([place]) Active Problems:   Hyperthyroidism   COPD (chronic obstructive pulmonary disease) (HCC)   Tobacco abuse   History of esophageal stricture  Resolved Problems:   * No resolved hospital problems. *   Hospital Course: Krista Ramirez is a 66 y.o. female with medical history significant of hypothyroidism, tobacco use recently quit smoking 5 weeks ago who presents with shortness of breath.  She states that since she quit smoking, she has had worsening productive cough.  She also admits to some epigastric pain with swallowing food.  She had an esophageal stricture decades ago which needed endoscopy and dilatation.  Otherwise denies any chest pain.   In the emergency department, she was found to be in A-fib RVR with heart rate in the 150s.  She was started on IV Cardizem and converted to normal sinus rhythm.  Patient was seen by cardiology, underwent echocardiogram. Her work up also revealed hyperthyroidism (states she stopped taking methimazole 6 months ago).  She remained stable on oral Cardizem and was discharged home in stable condition.  Assessment and Plan: * Atrial fibrillation with RVR ([place])- (present on admission) Cardizem gtt --> p.o. Cardizem Appreciate cardiology, follow-up with them outpatient Eliquis  Hyperthyroidism States she was started on methimazole about 3 years ago, stopped 6 months ago TSH <0.010 T4 4.79, T3 26.5 Started on methimazole She will need to  follow-up with endocrinology referral  History of esophageal stricture States that she underwent EGD and dilatation many years ago, now having symptoms of food getting stuck in her chest She will need outpatient GI referral  Tobacco abuse Congratulated on her quitting smoking 5 weeks ago           Consultants: Cardiology Procedures performed: None Disposition: Home Diet recommendation:  Cardiac diet  DISCHARGE MEDICATION: Allergies as of 03/30/2021   No Known Allergies      Medication List     TAKE these medications    apixaban 5 MG Tabs tablet Commonly known as: ELIQUIS Take 1 tablet (5 mg total) by mouth 2 (two) times daily.   dextromethorphan-guaiFENesin 30-600 MG 12hr tablet Commonly known as: MUCINEX DM Take 1 tablet by mouth 2 (two) times daily as needed for cough.   diltiazem 120 MG 24 hr capsule Commonly known as: CARDIZEM CD Take 1 capsule (120 mg total) by mouth daily.   methimazole 10 MG tablet Commonly known as: TAPAZOLE Take 1 tablet (10 mg total) by mouth 2 (two) times daily.        Follow-up Information     Furth, Cadence H, PA-C Follow up on 04/27/2021.   Specialty: Cardiology Why: Cardiology Hospital Follow-up on 04/27/2021 at 3:05 PM. Contact information: [address] [place] [address] [phone]         PCP Follow up.   Why: Establish with a PCP as soon as possible. PCP to arrange endocrinology and GI referral                Discharge Exam: Filed Weights   03/29/21  6579 03/29/21 1137 03/30/21 0438  Weight: 63.5 kg 60.4 kg 61 kg   Examination: General exam: Appears calm and comfortable  Respiratory system: Clear to auscultation. Respiratory effort normal. Cardiovascular system: S1 & S2 heard, RRR. No pedal edema. Gastrointestinal system: Abdomen is nondistended, soft and nontender. Normal bowel sounds heard. Central nervous system: Alert and oriented. Non focal exam. Speech clear  Extremities: Symmetric  in appearance bilaterally  Skin: No rashes, lesions or ulcers on exposed skin  Psychiatry: Judgement and insight appear stable. Mood & affect appropriate.    Condition at discharge: stable  The results of significant diagnostics from this hospitalization (including imaging, microbiology, ancillary and laboratory) are listed below for reference.   Imaging Studies: DG Chest Port 1 View  Result Date: 03/29/2021 CLINICAL DATA:  Shortness of breath, new onset atrial fibrillation EXAM: PORTABLE CHEST 1 VIEW COMPARISON:  Portable exam 0845 hours without priors for comparison FINDINGS: Upper normal heart size. Mediastinal contours and pulmonary vascularity normal. Atherosclerotic calcification aorta. Lungs clear. No infiltrate, pleural effusion, or pneumothorax. Osseous structures unremarkable. IMPRESSION: No acute abnormalities. Aortic Atherosclerosis (ICD10-I70.0). Electronically Signed   By: Ulyses Southward M.D.   On: 03/29/2021 08:49   ECHOCARDIOGRAM COMPLETE  Result Date: 03/29/2021    ECHOCARDIOGRAM REPORT   Patient Name:   Krista Ramirez Date of Exam: 03/29/2021 Medical Rec #:  [id number]      Height:       63.0 in Accession #:    [id number]     Weight:       133.2 lb Date of Birth:  07/21/1955      BSA:          1.627 m Patient Age:    65 years       BP:           122/68 mmHg Patient Gender: F              HR:           70 bpm. [facility] Procedure: 2D Echo, Cardiac Doppler and Color Doppler Indications:    Atrial fibrillation  History:        Patient has no prior history of Echocardiogram examinations.                 COPD; Arrythmias:Atrial Fibrillation.  Sonographer:    Mikki Harbor Referring Phys: [id number] MICHELE M LENZE IMPRESSIONS  1. Left ventricular ejection fraction, by estimation, is 60 to 65%. The left ventricle has normal function. The left ventricle has no regional wall motion abnormalities. Left ventricular diastolic parameters are indeterminate.  2. Right ventricular  systolic function is normal. The right ventricular size is normal. There is normal pulmonary artery systolic pressure.  3. Left atrial size was mildly dilated.  4. The mitral valve is normal in structure. Mild mitral valve regurgitation. No evidence of mitral stenosis.  5. The aortic valve is tricuspid. Aortic valve regurgitation is mild. No aortic stenosis is present.  6. The inferior vena cava is normal in size with greater than 50% respiratory variability, suggesting right atrial pressure of 3 mmHg. FINDINGS  Left Ventricle: Left ventricular ejection fraction, by estimation, is 60 to 65%. The left ventricle has normal function. The left ventricle has no regional wall motion abnormalities. The left ventricular internal cavity size was normal in size. There is  no left ventricular hypertrophy. Left ventricular diastolic parameters are indeterminate. Right Ventricle: The right ventricular size is normal. No increase in right ventricular wall thickness.  Right ventricular systolic function is normal. There is normal pulmonary artery systolic pressure. The tricuspid regurgitant velocity is 2.55 m/s, and  with an assumed right atrial pressure of 3 mmHg, the estimated right ventricular systolic pressure is 0000000 mmHg. Left Atrium: Left atrial size was mildly dilated. Right Atrium: Right atrial size was normal in size. Pericardium: There is no evidence of pericardial effusion. Mitral Valve: The mitral valve is normal in structure. Mild mitral valve regurgitation. No evidence of mitral valve stenosis. MV peak gradient, 6.0 mmHg. The mean mitral valve gradient is 2.0 mmHg. Tricuspid Valve: The tricuspid valve is normal in structure. Tricuspid valve regurgitation is mild . No evidence of tricuspid stenosis. Aortic Valve: The aortic valve is tricuspid. Aortic valve regurgitation is mild. No aortic stenosis is present. Aortic valve mean gradient measures 8.0 mmHg. Aortic valve peak gradient measures 16.2 mmHg. Aortic valve  area, by VTI measures 1.97 cm. Pulmonic Valve: The pulmonic valve was not well visualized. Pulmonic valve regurgitation is mild. No evidence of pulmonic stenosis. Aorta: The aortic root is normal in size and structure. Venous: The inferior vena cava is normal in size with greater than 50% respiratory variability, suggesting right atrial pressure of 3 mmHg. IAS/Shunts: No atrial level shunt detected by color flow Doppler.  LEFT VENTRICLE PLAX 2D LVIDd:         4.30 cm     Diastology LVIDs:         2.40 cm     LV e' medial:    7.18 cm/s LV PW:         1.00 cm     LV E/e' medial:  17.4 LV IVS:        1.00 cm     LV e' lateral:   10.60 cm/s LVOT diam:     2.00 cm     LV E/e' lateral: 11.8 LV SV:         82 LV SV Index:   50 LVOT Area:     3.14 cm  LV Volumes (MOD) LV vol d, MOD A2C: 70.5 ml LV vol d, MOD A4C: 67.8 ml LV vol s, MOD A2C: 19.7 ml LV vol s, MOD A4C: 28.8 ml LV SV MOD A2C:     50.8 ml LV SV MOD A4C:     67.8 ml LV SV MOD BP:      43.9 ml RIGHT VENTRICLE RV Basal diam:  3.50 cm RV Mid diam:    2.80 cm RV S prime:     14.80 cm/s TAPSE (M-mode): 3.3 cm LEFT ATRIUM             Index        RIGHT ATRIUM           Index LA diam:        3.60 cm 2.21 cm/m   RA Area:     21.20 cm LA Vol (A2C):   56.6 ml 34.80 ml/m  RA Volume:   65.00 ml  39.96 ml/m LA Vol (A4C):   65.9 ml 40.51 ml/m LA Biplane Vol: 64.6 ml 39.71 ml/m  AORTIC VALVE                     PULMONIC VALVE AV Area (Vmax):    2.24 cm      PV Vmax:       0.95 m/s AV Area (Vmean):   2.03 cm      PV Peak grad:  3.6 mmHg AV Area (VTI):  1.97 cm AV Vmax:           201.00 cm/s AV Vmean:          128.000 cm/s AV VTI:            0.414 m AV Peak Grad:      16.2 mmHg AV Mean Grad:      8.0 mmHg LVOT Vmax:         143.00 cm/s LVOT Vmean:        82.600 cm/s LVOT VTI:          0.260 m LVOT/AV VTI ratio: 0.63  AORTA Ao Root diam: 2.80 cm MITRAL VALVE                TRICUSPID VALVE MV Area (PHT): 3.24 cm     TR Peak grad:   26.0 mmHg MV Area VTI:   2.15 cm      TR Vmax:        255.00 cm/s MV Peak grad:  6.0 mmHg MV Mean grad:  2.0 mmHg     SHUNTS MV Vmax:       1.22 m/s     Systemic VTI:  0.26 m MV Vmean:      72.2 cm/s    Systemic Diam: 2.00 cm MV Decel Time: 234 msec MV E velocity: 125.00 cm/s MV A velocity: 82.90 cm/s MV E/A ratio:  1.51 Carlyle Dolly MD Electronically signed by Carlyle Dolly MD Signature Date/Time: 03/29/2021/1:12:32 PM    Final     Microbiology: Results for orders placed or performed during the hospital encounter of 03/29/21  Resp Panel by RT-PCR (Flu A&B, Covid) Nasopharyngeal Swab     Status: None   Collection Time: 03/29/21  9:40 AM   Specimen: Nasopharyngeal Swab; Nasopharyngeal(NP) swabs in vial transport medium  Result Value Ref Range Status   SARS Coronavirus 2 by RT PCR NEGATIVE NEGATIVE Final    Comment: (NOTE) SARS-CoV-2 target nucleic acids are NOT DETECTED.  The SARS-CoV-2 RNA is generally detectable in upper respiratory specimens during the acute phase of infection. The lowest concentration of SARS-CoV-2 viral copies this assay can detect is 138 copies/mL. A negative result does not preclude SARS-Cov-2 infection and should not be used as the sole basis for treatment or other patient management decisions. A negative result may occur with  improper specimen collection/handling, submission of specimen other than nasopharyngeal swab, presence of viral mutation(s) within the areas targeted by this assay, and inadequate number of viral copies(<138 copies/mL). A negative result must be combined with clinical observations, patient history, and epidemiological information. The expected result is Negative.  Fact Sheet for Patients:  EntrepreneurPulse.com.au  Fact Sheet for Healthcare Providers:  IncredibleEmployment.be  This test is no t yet approved or cleared by the [place] FDA and  has been authorized for detection and/or diagnosis of SARS-CoV-2 by FDA under an  Emergency Use Authorization (EUA). This EUA will remain  in effect (meaning this test can be used) for the duration of the COVID-19 declaration under Section 564(b)(1) of the Act, 21 U.S.C.section 360bbb-3(b)(1), unless the authorization is terminated  or revoked sooner.       Influenza A by PCR NEGATIVE NEGATIVE Final   Influenza B by PCR NEGATIVE NEGATIVE Final    Comment: (NOTE) The Xpert Xpress SARS-CoV-2/FLU/RSV plus assay is intended as an aid in the diagnosis of influenza from Nasopharyngeal swab specimens and should not be used as a sole basis for treatment. Nasal washings and aspirates are unacceptable  for Xpert Xpress SARS-CoV-2/FLU/RSV testing.  Fact Sheet for Patients: EntrepreneurPulse.com.au  Fact Sheet for Healthcare Providers: IncredibleEmployment.be  This test is not yet approved or cleared by the [place] FDA and has been authorized for detection and/or diagnosis of SARS-CoV-2 by FDA under an Emergency Use Authorization (EUA). This EUA will remain in effect (meaning this test can be used) for the duration of the COVID-19 declaration under Section 564(b)(1) of the Act, 21 U.S.C. section 360bbb-3(b)(1), unless the authorization is terminated or revoked.  Performed at [facility], [address] [place], [place] [address]   MRSA Next Gen by PCR, Nasal     Status: None   Collection Time: 03/29/21 11:41 AM   Specimen: Nasal Mucosa; Nasal Swab  Result Value Ref Range Status   MRSA by PCR Next Gen NOT DETECTED NOT DETECTED Final    Comment: (NOTE) The GeneXpert MRSA Assay (FDA approved for NASAL specimens only), is one component of a comprehensive MRSA colonization surveillance program. It is not intended to diagnose MRSA infection nor to guide or monitor treatment for MRSA infections. Test performance is not FDA approved in patients less than 77 years old. Performed at [facility], [address], [place],   [address]     Labs: CBC: Recent Labs  Lab 03/29/21 0815 03/30/21 0438  WBC 5.5 4.6  HGB 10.1* 8.8*  HCT 31.2* 29.0*  MCV 89.1 88.7  PLT 250 [id number]   Basic Metabolic Panel: Recent Labs  Lab 03/29/21 0815 03/30/21 0438  NA 140 140  K 3.5 4.4  CL 107 110  CO2 24 21*  GLUCOSE 142* 105*  BUN 13 28*  CREATININE 0.52 0.56  CALCIUM 9.3 9.3  MG 1.8 2.0   Liver Function Tests: Recent Labs  Lab 03/29/21 0815  AST 27  ALT 23  ALKPHOS 171*  BILITOT 0.7  PROT 6.3*  ALBUMIN 3.4*   CBG: Recent Labs  Lab 03/29/21 0821  GLUCAP 139*    Discharge time spent: less than 30 minutes.  Signed: Dessa Phi, DO [facility] 03/30/2021

## 2021-03-30 NOTE — Progress Notes (Signed)
Progress Note  Patient Name: Krista Ramirez Date of Encounter: 03/30/2021  [facility] Cardiologist: New, Dr Wyline Mood  Subjective   No complaints  Inpatient Medications    Scheduled Meds:  apixaban  5 mg Oral BID   Chlorhexidine Gluconate Cloth  6 each Topical Q0600   diltiazem  60 mg Oral TID   Continuous Infusions:  diltiazem (CARDIZEM) infusion 5 mg/hr (03/29/21 1541)   PRN Meds: acetaminophen, albuterol, docusate sodium, guaiFENesin-dextromethorphan, ondansetron (ZOFRAN) IV, polyethylene glycol   Vital Signs    Vitals:   03/29/21 1944 03/29/21 2000 03/29/21 2100 03/30/21 0438  BP:  (!) 120/53    Pulse:  62 68   Resp:  (!) 27 (!) 28   Temp: 98.2 F (36.8 C)   98.2 F (36.8 C)  TempSrc: Oral   Oral  SpO2:  97% 97%   Weight:    61 kg  Height:        Intake/Output Summary (Last 24 hours) at 03/30/2021 0755 Last data filed at 03/29/2021 1541 Gross per 24 hour  Intake 293.38 ml  Output --  Net 293.38 ml   Last 3 Weights 03/30/2021 03/29/2021 03/29/2021  Weight (lbs) 134 lb 7.7 oz 133 lb 2.5 oz 140 lb  Weight (kg) 61 kg 60.4 kg 63.504 kg      Telemetry    SR - Personally Reviewed  ECG    N/a - Personally Reviewed  Physical Exam   GEN: No acute distress.   Neck: No JVD Cardiac: RRR, no murmurs, rubs, or gallops.  Respiratory: Clear to auscultation bilaterally. GI: Soft, nontender, non-distended  MS: No edema; No deformity. Neuro:  Nonfocal  Psych: Normal affect   Labs    High Sensitivity Troponin:   Recent Labs  Lab 03/29/21 0815 03/29/21 1025  TROPONINIHS 12 12     Chemistry Recent Labs  Lab 03/29/21 0815 03/30/21 0438  NA 140 140  K 3.5 4.4  CL 107 110  CO2 24 21*  GLUCOSE 142* 105*  BUN 13 28*  CREATININE 0.52 0.56  CALCIUM 9.3 9.3  MG 1.8 2.0  PROT 6.3*  --   ALBUMIN 3.4*  --   AST 27  --   ALT 23  --   ALKPHOS 171*  --   BILITOT 0.7  --   GFRNONAA >60 >60  ANIONGAP 9 9    Lipids No results for input(s): CHOL, TRIG,  HDL, LABVLDL, LDLCALC, CHOLHDL in the last 168 hours.  Hematology Recent Labs  Lab 03/29/21 0815 03/30/21 0438  WBC 5.5 4.6  RBC 3.50* 3.27*  HGB 10.1* 8.8*  HCT 31.2* 29.0*  MCV 89.1 88.7  MCH 28.9 26.9  MCHC 32.4 30.3  RDW 14.2 14.2  PLT 250 224   Thyroid  Recent Labs  Lab 03/29/21 0816  TSH <0.010*  FREET4 4.79*    BNP Recent Labs  Lab 03/29/21 0817  BNP 628.0*    DDimer No results for input(s): DDIMER in the last 168 hours.   Radiology    DG Chest Port 1 View  Result Date: 03/29/2021 CLINICAL DATA:  Shortness of breath, new onset atrial fibrillation EXAM: PORTABLE CHEST 1 VIEW COMPARISON:  Portable exam 0845 hours without priors for comparison FINDINGS: Upper normal heart size. Mediastinal contours and pulmonary vascularity normal. Atherosclerotic calcification aorta. Lungs clear. No infiltrate, pleural effusion, or pneumothorax. Osseous structures unremarkable. IMPRESSION: No acute abnormalities. Aortic Atherosclerosis (ICD10-I70.0). Electronically Signed   By: Ulyses Southward M.D.   On: 03/29/2021 08:49  ECHOCARDIOGRAM COMPLETE  Result Date: 03/29/2021    ECHOCARDIOGRAM REPORT   Patient Name:   Krista Ramirez Date of Exam: 03/29/2021 Medical Rec #:  [id number]      Height:       63.0 in Accession #:    [id number]     Weight:       133.2 lb Date of Birth:  1955/11/02      BSA:          1.627 m Patient Age:    65 years       BP:           122/68 mmHg Patient Gender: F              HR:           70 bpm. Exam Location:  [facility] Procedure: 2D Echo, Cardiac Doppler and Color Doppler Indications:    Atrial fibrillation  History:        Patient has no prior history of Echocardiogram examinations.                 COPD; Arrythmias:Atrial Fibrillation.  Sonographer:    Wenda Low Referring Phys: [place]  1. Left ventricular ejection fraction, by estimation, is 60 to 65%. The left ventricle has normal function. The left ventricle has no regional wall  motion abnormalities. Left ventricular diastolic parameters are indeterminate.  2. Right ventricular systolic function is normal. The right ventricular size is normal. There is normal pulmonary artery systolic pressure.  3. Left atrial size was mildly dilated.  4. The mitral valve is normal in structure. Mild mitral valve regurgitation. No evidence of mitral stenosis.  5. The aortic valve is tricuspid. Aortic valve regurgitation is mild. No aortic stenosis is present.  6. The inferior vena cava is normal in size with greater than 50% respiratory variability, suggesting right atrial pressure of 3 mmHg. FINDINGS  Left Ventricle: Left ventricular ejection fraction, by estimation, is 60 to 65%. The left ventricle has normal function. The left ventricle has no regional wall motion abnormalities. The left ventricular internal cavity size was normal in size. There is  no left ventricular hypertrophy. Left ventricular diastolic parameters are indeterminate. Right Ventricle: The right ventricular size is normal. No increase in right ventricular wall thickness. Right ventricular systolic function is normal. There is normal pulmonary artery systolic pressure. The tricuspid regurgitant velocity is 2.55 m/s, and  with an assumed right atrial pressure of 3 mmHg, the estimated right ventricular systolic pressure is 0000000 mmHg. Left Atrium: Left atrial size was mildly dilated. Right Atrium: Right atrial size was normal in size. Pericardium: There is no evidence of pericardial effusion. Mitral Valve: The mitral valve is normal in structure. Mild mitral valve regurgitation. No evidence of mitral valve stenosis. MV peak gradient, 6.0 mmHg. The mean mitral valve gradient is 2.0 mmHg. Tricuspid Valve: The tricuspid valve is normal in structure. Tricuspid valve regurgitation is mild . No evidence of tricuspid stenosis. Aortic Valve: The aortic valve is tricuspid. Aortic valve regurgitation is mild. No aortic stenosis is present. Aortic  valve mean gradient measures 8.0 mmHg. Aortic valve peak gradient measures 16.2 mmHg. Aortic valve area, by VTI measures 1.97 cm. Pulmonic Valve: The pulmonic valve was not well visualized. Pulmonic valve regurgitation is mild. No evidence of pulmonic stenosis. Aorta: The aortic root is normal in size and structure. Venous: The inferior vena cava is normal in size with greater than 50% respiratory variability, suggesting right atrial pressure  of 3 mmHg. IAS/Shunts: No atrial level shunt detected by color flow Doppler.  LEFT VENTRICLE PLAX 2D LVIDd:         4.30 cm     Diastology LVIDs:         2.40 cm     LV e' medial:    7.18 cm/s LV PW:         1.00 cm     LV E/e' medial:  17.4 LV IVS:        1.00 cm     LV e' lateral:   10.60 cm/s LVOT diam:     2.00 cm     LV E/e' lateral: 11.8 LV SV:         82 LV SV Index:   50 LVOT Area:     3.14 cm  LV Volumes (MOD) LV vol d, MOD A2C: 70.5 ml LV vol d, MOD A4C: 67.8 ml LV vol s, MOD A2C: 19.7 ml LV vol s, MOD A4C: 28.8 ml LV SV MOD A2C:     50.8 ml LV SV MOD A4C:     67.8 ml LV SV MOD BP:      43.9 ml RIGHT VENTRICLE RV Basal diam:  3.50 cm RV Mid diam:    2.80 cm RV S prime:     14.80 cm/s TAPSE (M-mode): 3.3 cm LEFT ATRIUM             Index        RIGHT ATRIUM           Index LA diam:        3.60 cm 2.21 cm/m   RA Area:     21.20 cm LA Vol (A2C):   56.6 ml 34.80 ml/m  RA Volume:   65.00 ml  39.96 ml/m LA Vol (A4C):   65.9 ml 40.51 ml/m LA Biplane Vol: 64.6 ml 39.71 ml/m  AORTIC VALVE                     PULMONIC VALVE AV Area (Vmax):    2.24 cm      PV Vmax:       0.95 m/s AV Area (Vmean):   2.03 cm      PV Peak grad:  3.6 mmHg AV Area (VTI):     1.97 cm AV Vmax:           201.00 cm/s AV Vmean:          128.000 cm/s AV VTI:            0.414 m AV Peak Grad:      16.2 mmHg AV Mean Grad:      8.0 mmHg LVOT Vmax:         143.00 cm/s LVOT Vmean:        82.600 cm/s LVOT VTI:          0.260 m LVOT/AV VTI ratio: 0.63  AORTA Ao Root diam: 2.80 cm MITRAL VALVE                 TRICUSPID VALVE MV Area (PHT): 3.24 cm     TR Peak grad:   26.0 mmHg MV Area VTI:   2.15 cm     TR Vmax:        255.00 cm/s MV Peak grad:  6.0 mmHg MV Mean grad:  2.0 mmHg     SHUNTS MV Vmax:       1.22 m/s     Systemic  VTI:  0.26 m MV Vmean:      72.2 cm/s    Systemic Diam: 2.00 cm MV Decel Time: 234 msec MV E velocity: 125.00 cm/s MV A velocity: 82.90 cm/s MV E/A ratio:  1.51 Carlyle Dolly MD Electronically signed by Carlyle Dolly MD Signature Date/Time: 03/29/2021/1:12:32 PM    Final     Cardiac Studies   03/2021 echo 1. Left ventricular ejection fraction, by estimation, is 60 to 65%. The  left ventricle has normal function. The left ventricle has no regional  wall motion abnormalities. Left ventricular diastolic parameters are  indeterminate.   2. Right ventricular systolic function is normal. The right ventricular  size is normal. There is normal pulmonary artery systolic pressure.   3. Left atrial size was mildly dilated.   4. The mitral valve is normal in structure. Mild mitral valve  regurgitation. No evidence of mitral stenosis.   5. The aortic valve is tricuspid. Aortic valve regurgitation is mild. No  aortic stenosis is present.   6. The inferior vena cava is normal in size with greater than 50%  respiratory variability, suggesting right atrial pressure of 3 mmHg.   Patient Profile     Krista Ramirez is a 66 y.o. female with a hx of hyperthyroidism who is being seen 03/29/2021 for the evaluation of new Afib at the request of Dr. Maylene Roes.    Assessment & Plan    1.Afib w/ RVR - new diagnosis this admission - started on dilt gtt, she has already converted back to NSR - no significant findnings on echo  - started oral diltiazem 60mg  tid with hold parameters. BP's look good, remains in NSR - symptosm on admission completley resolved with rate control  - CHADS2Vasc score is at least 2(age, gender), started on eliquis 5mg  bid - transition to oral diltiazem long acting 120mg   daily.    2.Hyperthyroidism - significantly low TSH, free T4 pending - management per primary team   Cardiology will sign off inpatient care, we will arrange outpatient f/u. [place] for discharge from our standpoint.   For questions or updates, please contact [facility] Please consult [email or web address] for contact info under        Signed, Carlyle Dolly, MD  03/30/2021, 7:55 AM

## 2021-04-08 ENCOUNTER — Ambulatory Visit (INDEPENDENT_AMBULATORY_CARE_PROVIDER_SITE_OTHER): Payer: Medicare Other | Admitting: Internal Medicine

## 2021-04-08 ENCOUNTER — Encounter: Payer: Self-pay | Admitting: Internal Medicine

## 2021-04-08 ENCOUNTER — Other Ambulatory Visit: Payer: Self-pay

## 2021-04-08 VITALS — BP 122/64 | HR 81 | Resp 17 | Ht 63.0 in | Wt 133.6 lb

## 2021-04-08 DIAGNOSIS — F40240 Claustrophobia: Secondary | ICD-10-CM

## 2021-04-08 DIAGNOSIS — F17211 Nicotine dependence, cigarettes, in remission: Secondary | ICD-10-CM

## 2021-04-08 DIAGNOSIS — I4891 Unspecified atrial fibrillation: Secondary | ICD-10-CM | POA: Diagnosis not present

## 2021-04-08 DIAGNOSIS — E0590 Thyrotoxicosis, unspecified without thyrotoxic crisis or storm: Secondary | ICD-10-CM

## 2021-04-08 DIAGNOSIS — Z8719 Personal history of other diseases of the digestive system: Secondary | ICD-10-CM

## 2021-04-08 DIAGNOSIS — I700 Atherosclerosis of aorta: Secondary | ICD-10-CM | POA: Insufficient documentation

## 2021-04-08 DIAGNOSIS — Z1211 Encounter for screening for malignant neoplasm of colon: Secondary | ICD-10-CM

## 2021-04-08 DIAGNOSIS — F411 Generalized anxiety disorder: Secondary | ICD-10-CM

## 2021-04-08 DIAGNOSIS — J449 Chronic obstructive pulmonary disease, unspecified: Secondary | ICD-10-CM

## 2021-04-08 MED ORDER — CITALOPRAM HYDROBROMIDE 10 MG PO TABS: 10.0000 mg | ORAL_TABLET | Freq: Every day | ORAL | 3 refills | Status: DC

## 2021-04-08 MED ORDER — LORAZEPAM 1 MG PO TABS: ORAL_TABLET | ORAL | 0 refills | Status: DC

## 2021-04-08 MED ORDER — ALBUTEROL SULFATE HFA 108 (90 BASE) MCG/ACT IN AERS
2.0000 | INHALATION_SPRAY | Freq: Four times a day (QID) | RESPIRATORY_TRACT | 5 refills | Status: AC | PRN
Start: 2021-04-08 — End: ?

## 2021-04-08 NOTE — Assessment & Plan Note (Signed)
Check low-dose CT chest for lung cancer screening

## 2021-04-08 NOTE — Assessment & Plan Note (Signed)
Currently on methimazole Had stopped taking it in the past, which led to uncontrolled hyperthyroidism complicated with A-fib with RVR Referred to endocrinology

## 2021-04-08 NOTE — Assessment & Plan Note (Signed)
Had dysphagia during hospital course Chart review suggests history of esophageal stricture, but denies any dysphagia currently

## 2021-04-08 NOTE — Assessment & Plan Note (Signed)
Has quit smoking now Has history of 30-pack-year smoking Uses albuterol as needed for dyspnea or wheezing 

## 2021-04-08 NOTE — Patient Instructions (Signed)
Please start taking Celexa for anxiety. Please take Ativan as prescribed before CT chest.  Please continue to take other medications as prescribed.  You are being referred to Endocrinology.

## 2021-04-08 NOTE — Assessment & Plan Note (Addendum)
Could be due to uncontrolled hyperthyroidism Recently started methimazole Started Celexa for anxiety Ativan before CT chest

## 2021-04-08 NOTE — Assessment & Plan Note (Signed)
Had A-fib with RVR during recent hospital visit, likely due to uncontrolled hyperthyroidism Currently in sinus rhythm Currently on Diltiazem and Eliquis Going to see cardiology

## 2021-04-08 NOTE — Assessment & Plan Note (Signed)
Noted on CXR Will check lipid profile later and decide about statin 

## 2021-04-08 NOTE — Progress Notes (Signed)
New Patient Office Visit  Subjective:  Patient ID: Krista Ramirez, female    DOB: 12-15-1955  Age: 66 y.o. MRN: [id number]  CC:  Chief Complaint  Patient presents with   Establish Care    Pt has some concerns to talk about    HPI Krista Ramirez is a 66 y.o. female with past medical history of hyperthyroidism, atrial fibrillation and GAD who presents for establishing care.  She was recently hospitalized for A-fib with RVR and was placed on current Cardizem and Eliquis.  She is currently in sinus rhythm.  She denies any dizziness, chest pain or palpitations currently.  She had history of hyperthyroidism, for which she was given methimazole, but she had stopped taking it.  Her last TSH is < 0.010 during hospitalization.  She has started taking methimazole again.  She had weight loss and severe anxiety when she was diagnosed with hyperthyroidism, but she later stopped taking methimazole as she started gaining weight.  She has h/o 30 pack-year smoking history, but quit recently.  She has albuterol inhaler as needed for dyspnea or wheezing.  She has a history of severe GAD and claustrophobia.  Denies any anhedonia, SI or HI currently.  She denies colonoscopy, but agrees to get Cologuard.  Past Medical History:  Diagnosis Date   COPD (chronic obstructive pulmonary disease) ([place])    Hyperthyroidism     Past Surgical History:  Procedure Laterality Date   SKIN GRAFT     TUBAL LIGATION      History reviewed. No pertinent family history.  Social History   Socioeconomic History   Marital status: Married    Spouse name: Not on file   Number of children: Not on file   Years of education: Not on file   Highest education level: Not on file  Occupational History   Not on file  Tobacco Use   Smoking status: Former    Packs/day: 1.00    Years: 30.00    Pack years: 30.00    Types: Cigarettes    Quit date: 02/22/2021    Years since quitting: 0.1   Smokeless tobacco: Never  Vaping  Use   Vaping Use: Never used  Substance and Sexual Activity   Alcohol use: Not Currently   Drug use: Not Currently   Sexual activity: Not on file  Other Topics Concern   Not on file  Social History Narrative   Not on file   Social Determinants of Health   Financial Resource Strain: Not on file  Food Insecurity: Not on file  Transportation Needs: Not on file  Physical Activity: Not on file  Stress: Not on file  Social Connections: Not on file  Intimate Partner Violence: Not on file    ROS Review of Systems  Constitutional:  Positive for unexpected weight change. Negative for chills and fever.  HENT:  Negative for congestion, sinus pressure, sinus pain and sore throat.   Eyes:  Negative for pain and discharge.  Respiratory:  Negative for cough and shortness of breath.   Cardiovascular:  Negative for chest pain and palpitations.  Gastrointestinal:  Negative for abdominal pain, diarrhea, nausea and vomiting.  Endocrine: Negative for polydipsia and polyuria.  Genitourinary:  Negative for dysuria and hematuria.  Musculoskeletal:  Negative for neck pain and neck stiffness.  Skin:  Negative for rash.  Neurological:  Negative for dizziness and weakness.  Psychiatric/Behavioral:  Negative for agitation and behavioral problems. The patient is nervous/anxious.    Objective:   Today's Vitals:  BP 122/64    Pulse 81    Resp 17    Ht 5\' 3"  (1.6 m)    Wt 133 lb 9.6 oz (60.6 kg)    SpO2 98%    BMI 23.67 kg/m   Physical Exam Vitals reviewed.  Constitutional:      General: She is not in acute distress.    Appearance: She is not diaphoretic.  HENT:     Head: Normocephalic and atraumatic.     Nose: Nose normal.     Mouth/Throat:     Mouth: Mucous membranes are moist.  Eyes:     General: No scleral icterus.    Extraocular Movements: Extraocular movements intact.  Cardiovascular:     Rate and Rhythm: Normal rate and regular rhythm.     Pulses: Normal pulses.     Heart sounds: Normal  heart sounds. No murmur heard. Pulmonary:     Breath sounds: Normal breath sounds. No wheezing or rales.  Abdominal:     Palpations: Abdomen is soft.     Tenderness: There is no abdominal tenderness.  Musculoskeletal:     Cervical back: Neck supple. No tenderness.     Right lower leg: No edema.     Left lower leg: No edema.  Skin:    General: Skin is warm.     Findings: No rash.  Neurological:     General: No focal deficit present.     Mental Status: She is alert and oriented to person, place, and time.     Sensory: No sensory deficit.     Motor: No weakness.  Psychiatric:        Mood and Affect: Mood normal.        Behavior: Behavior normal.    Assessment & Plan:   Problem List Items Addressed This Visit       Cardiovascular and Mediastinum   Atrial fibrillation (HCC)    Had A-fib with RVR during recent hospital visit, likely due to uncontrolled hyperthyroidism Currently in sinus rhythm Currently on Diltiazem and Eliquis Going to see cardiology      Aortic atherosclerosis (HCC)    Noted on CXR Will check lipid profile later and decide about statin        Respiratory   COPD (chronic obstructive pulmonary disease) (HCC)    Has quit smoking now Has history of 30-pack-year smoking Uses albuterol as needed for dyspnea or wheezing      Relevant Medications   albuterol (VENTOLIN HFA) 108 (90 Base) MCG/ACT inhaler     Endocrine   Hyperthyroidism - Primary    Currently on methimazole Had stopped taking it in the past, which led to uncontrolled hyperthyroidism complicated with A-fib with RVR Referred to endocrinology      Relevant Orders   Ambulatory referral to Endocrinology     Other   History of esophageal stricture    Had dysphagia during hospital course Chart review suggests history of esophageal stricture, but denies any dysphagia currently      GAD (generalized anxiety disorder)    Could be due to uncontrolled hyperthyroidism Recently started  methimazole Started Celexa for anxiety Ativan before CT chest      Relevant Medications   LORazepam (ATIVAN) 1 MG tablet   citalopram (CELEXA) 10 MG tablet   Cigarette nicotine dependence in remission    Check low-dose CT chest for lung cancer screening      Relevant Orders   CT CHEST LUNG CANCER SCREENING LOW DOSE WO CONTRAST  Other Visit Diagnoses     Claustrophobia       Relevant Medications   LORazepam (ATIVAN) 1 MG tablet   citalopram (CELEXA) 10 MG tablet   Colon cancer screening       Relevant Orders   Cologuard       Outpatient Encounter Medications as of 04/08/2021  Medication Sig   albuterol (VENTOLIN HFA) 108 (90 Base) MCG/ACT inhaler Inhale 2 puffs into the lungs every 6 (six) hours as needed for wheezing or shortness of breath.   apixaban (ELIQUIS) 5 MG TABS tablet Take 1 tablet (5 mg total) by mouth 2 (two) times daily.   citalopram (CELEXA) 10 MG tablet Take 1 tablet (10 mg total) by mouth daily.   diltiazem (CARDIZEM CD) 120 MG 24 hr capsule Take 1 capsule (120 mg total) by mouth daily.   LORazepam (ATIVAN) 1 MG tablet Take 1 tablet before 2 hours before your CT chest.   methimazole (TAPAZOLE) 10 MG tablet Take 1 tablet (10 mg total) by mouth 2 (two) times daily.   dextromethorphan-guaiFENesin (MUCINEX DM) 30-600 MG 12hr tablet Take 1 tablet by mouth 2 (two) times daily as needed for cough. (Patient not taking: Reported on 04/08/2021)   No facility-administered encounter medications on file as of 04/08/2021.    Follow-up: Return in about 4 months (around 08/06/2021) for GAD.   Lindell Spar, MD

## 2021-04-16 ENCOUNTER — Encounter: Payer: Self-pay | Admitting: Nurse Practitioner

## 2021-04-19 ENCOUNTER — Encounter (HOSPITAL_COMMUNITY): Payer: Self-pay

## 2021-04-19 NOTE — Progress Notes (Signed)
Received referral for initial lung cancer screening scan. Unable to reach patient, detailed VM left asking that the patient return my call. ?

## 2021-04-20 ENCOUNTER — Ambulatory Visit (INDEPENDENT_AMBULATORY_CARE_PROVIDER_SITE_OTHER): Payer: Medicare Other | Admitting: Nurse Practitioner

## 2021-04-20 ENCOUNTER — Other Ambulatory Visit: Payer: Self-pay

## 2021-04-20 ENCOUNTER — Encounter: Payer: Self-pay | Admitting: Nurse Practitioner

## 2021-04-20 VITALS — BP 122/72 | HR 71 | Ht 62.3 in | Wt 134.8 lb

## 2021-04-20 DIAGNOSIS — E0590 Thyrotoxicosis, unspecified without thyrotoxic crisis or storm: Secondary | ICD-10-CM | POA: Diagnosis not present

## 2021-04-20 MED ORDER — PROPRANOLOL HCL 20 MG PO TABS: 20.0000 mg | ORAL_TABLET | Freq: Two times a day (BID) | ORAL | 2 refills | Status: DC

## 2021-04-20 NOTE — Progress Notes (Signed)
04/20/2021     Endocrinology Consult Note    Subjective:    Patient ID: Krista Ramirez, female    DOB: May 18, 1955, PCP Anabel Halon, MD.   Past Medical History:  Diagnosis Date   COPD (chronic obstructive pulmonary disease) (HCC)    Hyperthyroidism     Past Surgical History:  Procedure Laterality Date   SKIN GRAFT     TUBAL LIGATION      Social History   Socioeconomic History   Marital status: Married    Spouse name: Not on file   Number of children: Not on file   Years of education: Not on file   Highest education level: Not on file  Occupational History   Not on file  Tobacco Use   Smoking status: Former    Packs/day: 1.00    Years: 30.00    Pack years: 30.00    Types: Cigarettes    Quit date: 02/22/2021    Years since quitting: 0.1   Smokeless tobacco: Never  Vaping Use   Vaping Use: Never used  Substance and Sexual Activity   Alcohol use: Not Currently   Drug use: Not Currently   Sexual activity: Not on file  Other Topics Concern   Not on file  Social History Narrative   Not on file   Social Determinants of Health   Financial Resource Strain: Not on file  Food Insecurity: Not on file  Transportation Needs: Not on file  Physical Activity: Not on file  Stress: Not on file  Social Connections: Not on file    Family History  Problem Relation Age of Onset   Hypertension Mother     Outpatient Encounter Medications as of 04/20/2021  Medication Sig   albuterol (VENTOLIN HFA) 108 (90 Base) MCG/ACT inhaler Inhale 2 puffs into the lungs every 6 (six) hours as needed for wheezing or shortness of breath.   apixaban (ELIQUIS) 5 MG TABS tablet Take 1 tablet (5 mg total) by mouth 2 (two) times daily.   citalopram (CELEXA) 10 MG tablet Take 1 tablet (10 mg total) by mouth daily.   diltiazem (CARDIZEM CD) 120 MG 24 hr capsule Take 1 capsule (120 mg total) by mouth daily.   LORazepam (ATIVAN) 1 MG tablet Take 1 tablet before 2 hours before your CT  chest.   methimazole (TAPAZOLE) 10 MG tablet Take 1 tablet (10 mg total) by mouth 2 (two) times daily.   propranolol (INDERAL) 20 MG tablet Take 1 tablet (20 mg total) by mouth 2 (two) times daily.   dextromethorphan-guaiFENesin (MUCINEX DM) 30-600 MG 12hr tablet Take 1 tablet by mouth 2 (two) times daily as needed for cough. (Patient not taking: Reported on 04/08/2021)   No facility-administered encounter medications on file as of 04/20/2021.    ALLERGIES: No Known Allergies  VACCINATION STATUS: Immunization History  Administered Date(s) Administered   Influenza-Unspecified 12/03/2020   Pfizer Covid-19 Vaccine Bivalent Booster 26yrs & up 02/17/2019, 03/10/2019, 11/04/2019     HPI  Krista Ramirez is 66 y.o. female who presents today with a medical history as above. she is being seen in consultation for hyperthyroidism requested by Anabel Halon, MD.  she has been dealing with symptoms of anxiety, insomnia, irritability, palpitations, weight loss, and tremors for 6-8 months. These symptoms are progressively worsening and troubling to her.  her most recent thyroid labs revealed suppressed TSH of < 0.010, high Free T3 of 26.5 and high Free T4 of 4.79 on 03/29/21. she denies  dysphagia, choking, shortness of breath, no recent voice change.    she does have family history of thyroid dysfunction in her siblings, but denies family hx of thyroid cancer. she denies personal history of goiter. she is currently on Methimazole 10 mg po twice daily. Denies use of Biotin containing supplements.  she is willing to proceed with appropriate work up and therapy for thyrotoxicosis.   Review of systems  Constitutional: + recent weight loss, current Body mass index is 24.42 kg/m., no fatigue, no subjective hyperthermia, no subjective hypothermia Eyes: no blurry vision, no xerophthalmia ENT: no sore throat, no nodules palpated in throat, no dysphagia/odynophagia, no hoarseness Cardiovascular: no chest pain,  no shortness of breath, + palpitations, no leg swelling Respiratory: no cough, no shortness of breath Gastrointestinal: no nausea/vomiting/diarrhea Musculoskeletal: no muscle/joint aches Skin: no rashes, no hyperemia Neurological: + tremors, no numbness, no tingling, no dizziness Psychiatric: no depression, + anxiety, + insomnia, + irritability   Objective:    BP 122/72    Pulse 71    Ht 5' 2.3" (1.582 m)    Wt 134 lb 12.8 oz (61.1 kg)    SpO2 98%    BMI 24.42 kg/m   Wt Readings from Last 3 Encounters:  04/20/21 134 lb 12.8 oz (61.1 kg)  04/08/21 133 lb 9.6 oz (60.6 kg)  03/30/21 134 lb 7.7 oz (61 kg)     BP Readings from Last 3 Encounters:  04/20/21 122/72  04/08/21 122/64  03/30/21 (!) 134/55                          Physical Exam- Limited  Constitutional:  Body mass index is 24.42 kg/m. , not in acute distress, anxious state of mind Eyes:  EOMI, no exophthalmos Neck: Supple Thyroid: + gross goiter R>L Cardiovascular: RRR, no murmurs, rubs, or gallops, no edema Respiratory: Adequate breathing efforts, no crackles, rales, rhonchi, or wheezing Musculoskeletal: no gross deformities, strength intact in all four extremities, no gross restriction of joint movements Skin:  no rashes, no hyperemia Neurological: ++ tremor with outstretched hands, hyperactive DTR bilaterally   CMP     Component Value Date/Time   NA 140 03/30/2021 0438   K 4.4 03/30/2021 0438   CL 110 03/30/2021 0438   CO2 21 (L) 03/30/2021 0438   GLUCOSE 105 (H) 03/30/2021 0438   BUN 28 (H) 03/30/2021 0438   CREATININE 0.56 03/30/2021 0438   CALCIUM 9.3 03/30/2021 0438   PROT 6.3 (L) 03/29/2021 0815   ALBUMIN 3.4 (L) 03/29/2021 0815   AST 27 03/29/2021 0815   ALT 23 03/29/2021 0815   ALKPHOS 171 (H) 03/29/2021 0815   BILITOT 0.7 03/29/2021 0815   GFRNONAA >60 03/30/2021 0438     CBC    Component Value Date/Time   WBC 4.6 03/30/2021 0438   RBC 3.27 (L) 03/30/2021 0438   HGB 8.8 (L) 03/30/2021  0438   HCT 29.0 (L) 03/30/2021 0438   PLT 224 03/30/2021 0438   MCV 88.7 03/30/2021 0438   MCH 26.9 03/30/2021 0438   MCHC 30.3 03/30/2021 0438   RDW 14.2 03/30/2021 0438     Diabetic Labs (most recent): No results found for: HGBA1C  Lipid Panel  No results found for: CHOL, TRIG, HDL, CHOLHDL, VLDL, LDLCALC, LDLDIRECT, LABVLDL   Lab Results  Component Value Date   TSH <0.010 (L) 03/29/2021   FREET4 4.79 (H) 03/29/2021        Assessment & Plan:  1) Hyperthyroidism-unspecified  she is being seen at a kind request of Anabel Halon, MD.  her history and most recent labs are reviewed, and she was examined clinically. Subjective and objective findings are consistent with thyrotoxicosis likely from primary hyperthyroidism. The potential risks of untreated thyrotoxicosis and the need for definitive therapy have been discussed in detail with her, and she agrees to proceed with diagnostic workup and treatment plan.   I will repeat full profile thyroid function tests today, including antibody testing to assess for autoimmune thyroid dysfunction.  Will need to wait for confirmatory thyroid uptake and scan to be done until we can safely taper her off Methimazole.  She is advised to continue Methimazole 10 mg po twice daily for now.  Will also initiate beta-blocker to help reduce her symptoms.  I initiated Propanolol 20 mg po twice daily.   Options of therapy are discussed with her.  We discussed the option of treating it with medications including methimazole or PTU which may have side effects including rash, transaminitis, and bone marrow suppression.  We also discussed the option of definitive therapy with RAI ablation of the thyroid. If she is found to have primary hyperthyroidism from Graves' disease , toxic multinodular goiter or toxic nodular goiter the preferred modality of treatment would be I-131 thyroid ablation. Surgery is another choice of treatment in some cases, in her case  surgery is not a good fit for presentation with only mild goiter.  -Patient is made aware of the high likelihood of post ablative hypothyroidism with subsequent need for lifelong thyroid hormone replacement. sheunderstands this outcome and she is  willing to proceed.      she will return in 1 week for treatment decision.    -Patient is advised to maintain close follow up with Anabel Halon, MD for primary care needs.   - Time spent with the patient: 60 minutes, of which >50% was spent in obtaining information about her symptoms, reviewing her previous labs, evaluations, and treatments, counseling her about her hyperthyroidism , and developing a plan to confirm the diagnosis and long term treatment as necessary. Please refer to "Patient Self Inventory" in the Media tab for reviewed elements of pertinent patient history.  Krista Ramirez participated in the discussions, expressed understanding, and voiced agreement with the above plans.  All questions were answered to her satisfaction. she is encouraged to contact clinic should she have any questions or concerns prior to her return visit.   Follow up plan: Return in about 1 week (around 04/27/2021) for Thyroid follow up, Previsit labs, Virtual visit ok.   Thank you for involving me in the care of this pleasant patient, and I will continue to update you with her progress.    Ronny Bacon, [facility] [address] [place], [place] [address] Phone: [phone] Fax: [phone]  04/20/2021, 1:36 PM

## 2021-04-20 NOTE — Patient Instructions (Signed)
Hyperthyroidism  Hyperthyroidism is when the thyroid gland is too active (overactive). The thyroid gland is a small gland located in the lower front part of the neck, just in front of the windpipe (trachea). This gland makes hormones that help control how the body uses food for energy (metabolism) as well as how the heart and brain function. These hormones also play a role in keeping your bones strong. When the thyroid is overactive, it produces toomuch of a hormone called thyroxine. What are the causes? This condition may be caused by: Graves' disease. This is a disorder in which the body's disease-fighting system (immune system) attacks the thyroid gland. This is the most common cause. Inflammation of the thyroid gland. A tumor in the thyroid gland. Use of certain medicines, including: Prescription thyroid hormone replacement. Herbal supplements that mimic thyroid hormones. Amiodarone therapy. Solid or fluid-filled lumps within your thyroid gland (thyroid nodules). Taking in a large amount of iodine from foods or medicines. What increases the risk? You are more likely to develop this condition if: You are female. You have a family history of thyroid conditions. You smoke tobacco. You use a medicine called lithium. You take medicines that affect the immune system (immunosuppressants). What are the signs or symptoms? Symptoms of this condition include: Nervousness. Inability to tolerate heat. Unexplained weight loss. Diarrhea. Change in the texture of hair or skin. Heart skipping beats or making extra beats. Rapid heart rate. Loss of menstruation. Shaky hands. Fatigue. Restlessness. Sleep problems. Enlarged thyroid gland or a lump in the thyroid (nodule). You may also have symptoms of Graves' disease, which may include: Protruding eyes. Dry eyes. Red or swollen eyes. Problems with vision. How is this diagnosed? This condition may be diagnosed based on: Your symptoms and  medical history. A physical exam. Blood tests. Thyroid ultrasound. This test involves using sound waves to produce images of the thyroid gland. A thyroid scan. A radioactive substance is injected into a vein, and images show how much iodine is present in the thyroid. Radioactive iodine uptake test (RAIU). A small amount of radioactive iodine is given by mouth to see how much iodine the thyroid absorbs after a certain amount of time. How is this treated? Treatment depends on the cause and severity of the condition. Treatment may include: Medicines to reduce the amount of thyroid hormone your body makes. Radioactive iodine treatment (radioiodine therapy). This involves swallowing a small dose of radioactive iodine, in capsule or liquid form, to kill thyroid cells. Surgery to remove part or all of your thyroid gland. You may need to take thyroid hormone replacement medicine for the rest of your life after thyroid surgery. Medicines to help manage your symptoms. Follow these instructions at home:  Take over-the-counter and prescription medicines only as told by your health care provider. Do not use any products that contain nicotine or tobacco, such as cigarettes and e-cigarettes. If you need help quitting, ask your health care provider. Follow any instructions from your health care provider about diet. You may be instructed to limit foods that contain iodine. Keep all follow-up visits as told by your health care provider. This is important. You will need to have blood tests regularly so that your health care provider can monitor your condition. Contact a health care provider if: Your symptoms do not get better with treatment. You have a fever. You are taking thyroid hormone replacement medicine and you: Have symptoms of depression. Feel like you are tired all the time. Gain weight. Get help right   away if: You have chest pain. You have decreased alertness or a change in your awareness. You  have abdominal pain. You feel dizzy. You have a rapid heartbeat. You have an irregular heartbeat. You have difficulty breathing. Summary The thyroid gland is a small gland located in the lower front part of the neck, just in front of the windpipe (trachea). Hyperthyroidism is when the thyroid gland is too active (overactive) and produces too much of a hormone called thyroxine. The most common cause is Graves' disease, a disorder in which your immune system attacks the thyroid gland. Hyperthyroidism can cause various symptoms, such as unexplained weight loss, nervousness, inability to tolerate heat, or changes in your heartbeat. Treatment may include medicine to reduce the amount of thyroid hormone your body makes, radioiodine therapy, surgery, or medicines to manage symptoms. This information is not intended to replace advice given to you by your health care provider. Make sure you discuss any questions you have with your healthcare provider. Document Revised: 10/17/2019 Document Reviewed: 10/17/2019 Elsevier Patient Education  2022 Elsevier Inc.  

## 2021-04-21 ENCOUNTER — Other Ambulatory Visit: Payer: Self-pay | Admitting: Nurse Practitioner

## 2021-04-21 ENCOUNTER — Telehealth: Payer: Self-pay

## 2021-04-21 DIAGNOSIS — E0590 Thyrotoxicosis, unspecified without thyrotoxic crisis or storm: Secondary | ICD-10-CM

## 2021-04-21 LAB — THYROGLOBULIN ANTIBODY: Thyroglobulin Antibody: 1505.7 IU/mL — ABNORMAL HIGH (ref 0.0–0.9)

## 2021-04-21 LAB — T3, FREE: T3, Free: 4.2 pg/mL (ref 2.0–4.4)

## 2021-04-21 LAB — THYROID PEROXIDASE ANTIBODY: Thyroperoxidase Ab SerPl-aCnc: 210 IU/mL — ABNORMAL HIGH (ref 0–34)

## 2021-04-21 LAB — TSH: TSH: <0.005 u[IU]/mL — ABNORMAL LOW (ref 0.450–4.500)

## 2021-04-21 LAB — T4, FREE: Free T4: 0.75 ng/dL — ABNORMAL LOW (ref 0.82–1.77)

## 2021-04-21 NOTE — Telephone Encounter (Signed)
Called patient and left a detailed voice message for patient to call back to go over lab results.  ? ?Per Whitney, patient needs to be scheduled for approx 2 weeks from now since she ordered the uptake and scan. ?

## 2021-04-21 NOTE — Progress Notes (Signed)
Call patient and let her know her thyroid has responded nicely to the Methimazole.  So much so we can start tapering her off.  Have her reduce her Methimazole to 5 mg po twice daily for 1 week, then 5 mg po daily for 1 week, then stop altogether.  Once she has been off the medication for 5 days she can have the uptake and scan them proceed with definitive treatment.  I will go ahead and order the uptake and scan and make notes for them not to schedule for approx 2 weeks from now.

## 2021-04-22 NOTE — Telephone Encounter (Signed)
Pt called and said to please call her at [phone] ext [phone] ?

## 2021-04-22 NOTE — Telephone Encounter (Signed)
Patient called and she is having her uptake and scan done on 04/28/2021 and another appt on 04/29/2021 and I have scheduled her for 05/03/2021 for a virtual follow up as original plan. ?

## 2021-04-22 NOTE — Progress Notes (Signed)
Noted. Sounds good.

## 2021-04-26 ENCOUNTER — Telehealth: Payer: Medicare Other | Admitting: Nurse Practitioner

## 2021-04-27 ENCOUNTER — Encounter: Payer: Self-pay | Admitting: Medical

## 2021-04-27 ENCOUNTER — Ambulatory Visit (INDEPENDENT_AMBULATORY_CARE_PROVIDER_SITE_OTHER): Payer: Medicare Other | Admitting: Medical

## 2021-04-27 VITALS — BP 136/58 | HR 55 | Ht 62.5 in | Wt 138.6 lb

## 2021-04-27 DIAGNOSIS — I480 Paroxysmal atrial fibrillation: Secondary | ICD-10-CM | POA: Diagnosis not present

## 2021-04-27 DIAGNOSIS — E0590 Thyrotoxicosis, unspecified without thyrotoxic crisis or storm: Secondary | ICD-10-CM

## 2021-04-27 NOTE — Patient Instructions (Signed)
Labwork: ?CBC ? ?Follow-Up: ?Follow up in 3 months to establish with a MD ? ?Any Other Special Instructions Will Be Listed Below (If Applicable). ? ? ? ? ?If you need a refill on your cardiac medications before your next appointment, please call your pharmacy. ? ?

## 2021-04-27 NOTE — Progress Notes (Signed)
?Cardiology Office Note:   ? ?Date:  04/27/2021  ? ?ID:  Krista Ramirez, DOB Mar 18, 1955, MRN [id number] ? ?PCP:  Lindell Spar, MD  ?[facility] Cardiologist: Dr. Harl Bowie ?[place] Electrophysiologist:  None  ? ?Referring MD: No ref. provider found  ? ?Chief Complaint: Hospital follow-up ? ?History of Present Illness:   ? ?Krista Ramirez is a 66 y.o. female with a hx of hyperthyroidism, tobacco history, recent dx of afib who presents for hospital follow-up.  ? ?Admitted mid February with cough found to have Afib RVR converted on IV diltiazem. Started on Eliquis. Echo showed LVEF 60-65%, no WMA, mildly dilated left atrium, mild MR, mild AI. She was found to have hyperthyroidism, which she previously had, but stopped methimazole due to anxiety and weight gain. She was started on methimazole.  ? ?Today, the patient has been doing well since being home. PCP started propranolol. HR today is 55bpm. No chest pain, SOB, LLE orthopnea, pnd, palpitations. She reports compliance with Eliquis. She is on an anxiety medications, this is helping. She is back on methimazole and is following with endocrinology.  ? ?Past Medical History:  ?Diagnosis Date  ? COPD (chronic obstructive pulmonary disease) ([place])   ? Hyperthyroidism   ? ? ?Past Surgical History:  ?Procedure Laterality Date  ? SKIN GRAFT    ? TUBAL LIGATION    ? ? ?Current Medications: ?Current Meds  ?Medication Sig  ? albuterol (VENTOLIN HFA) 108 (90 Base) MCG/ACT inhaler Inhale 2 puffs into the lungs every 6 (six) hours as needed for wheezing or shortness of breath.  ? apixaban (ELIQUIS) 5 MG TABS tablet Take 1 tablet (5 mg total) by mouth 2 (two) times daily.  ? citalopram (CELEXA) 10 MG tablet Take 1 tablet (10 mg total) by mouth daily.  ? diltiazem (CARDIZEM CD) 120 MG 24 hr capsule Take 1 capsule (120 mg total) by mouth daily.  ? methimazole (TAPAZOLE) 10 MG tablet Take 1 tablet (10 mg total) by mouth 2 (two) times daily.  ? propranolol (INDERAL) 20 MG tablet  Take 1 tablet (20 mg total) by mouth 2 (two) times daily.  ?  ? ?Allergies:   Patient has no known allergies.  ? ?Social History  ? ?Socioeconomic History  ? Marital status: Married  ?  Spouse name: Not on file  ? Number of children: Not on file  ? Years of education: Not on file  ? Highest education level: Not on file  ?Occupational History  ? Not on file  ?Tobacco Use  ? Smoking status: Former  ?  Packs/day: 1.00  ?  Years: 30.00  ?  Pack years: 30.00  ?  Types: Cigarettes  ?  Quit date: 02/22/2021  ?  Years since quitting: 0.1  ? Smokeless tobacco: Never  ?Vaping Use  ? Vaping Use: Never used  ?Substance and Sexual Activity  ? Alcohol use: Not Currently  ? Drug use: Not Currently  ? Sexual activity: Not on file  ?Other Topics Concern  ? Not on file  ?Social History Narrative  ? Not on file  ? ?Social Determinants of Health  ? ?Financial Resource Strain: Not on file  ?Food Insecurity: Not on file  ?Transportation Needs: Not on file  ?Physical Activity: Not on file  ?Stress: Not on file  ?Social Connections: Not on file  ?  ? ?Family History: ?The patient's family history includes Hypertension in her mother. ? ?ROS:   ?Please see the history of present illness.    ?  All other systems reviewed and are negative. ? ?EKGs/Labs/Other Studies Reviewed:   ? ?The following studies were reviewed today: ? ?Echo 03/2021 ?1. Left ventricular ejection fraction, by estimation, is 60 to 65%. The  ?left ventricle has normal function. The left ventricle has no regional  ?wall motion abnormalities. Left ventricular diastolic parameters are  ?indeterminate.  ? 2. Right ventricular systolic function is normal. The right ventricular  ?size is normal. There is normal pulmonary artery systolic pressure.  ? 3. Left atrial size was mildly dilated.  ? 4. The mitral valve is normal in structure. Mild mitral valve  ?regurgitation. No evidence of mitral stenosis.  ? 5. The aortic valve is tricuspid. Aortic valve regurgitation is mild. No  ?aortic  stenosis is present.  ? 6. The inferior vena cava is normal in size with greater than 50%  ?respiratory variability, suggesting right atrial pressure of 3 mmHg.  ? ?EKG:  EKG is  ordered today.  The ekg ordered today demonstrates SB 57bpm, biatrial enlargement, nonspecific T wave changes.  ? ?Recent Labs: ?03/29/2021: ALT 23; B Natriuretic Peptide 628.0 ?03/30/2021: BUN 28; Creatinine, Ser 0.56; Hemoglobin 8.8; Magnesium 2.0; Platelets 224; Potassium 4.4; Sodium 140 ?04/20/2021: TSH <0.005  ?Recent Lipid Panel ?No results found for: CHOL, TRIG, HDL, CHOLHDL, VLDL, LDLCALC, LDLDIRECT ? ? ?Physical Exam:   ? ?VS:  BP (!) 136/58   Pulse (!) 55   Ht 5' 2.5" (1.588 m)   Wt 138 lb 9.6 oz (62.9 kg)   SpO2 98%   BMI 24.95 kg/m?    ? ?Wt Readings from Last 3 Encounters:  ?04/27/21 138 lb 9.6 oz (62.9 kg)  ?04/20/21 134 lb 12.8 oz (61.1 kg)  ?04/08/21 133 lb 9.6 oz (60.6 kg)  ?  ? ?GEN:  Well nourished, well developed in no acute distress ?HEENT: Normal ?NECK: No JVD; No carotid bruits ?LYMPHATICS: No lymphadenopathy ?CARDIAC: bradycardia, RR, no murmurs, rubs, gallops ?RESPIRATORY:  Clear to auscultation without rales, wheezing or rhonchi  ?ABDOMEN: Soft, non-tender, non-distended ?MUSCULOSKELETAL:  No edema; No deformity  ?SKIN: Warm and dry ?NEUROLOGIC:  Alert and oriented x 3 ?PSYCHIATRIC:  Normal affect  ? ?ASSESSMENT:   ? ?1. Paroxysmal A-fib ([place])   ?2. Hyperthyroidism   ? ?PLAN:   ? ?In order of problems listed above: ? ?Afib ?Afib RVR in the setting of hyperthyroidism., converted with IV dit. EKG today shows sinus bradycardia, 57bpm. Continue rate control with diltiazem 120mg  daily. Recommend she monitor her heart rate and BP. CHADSVASC at least 2 (age, gender) She is on Eliquis 5mg  BID. CBC today.  ? ?Hyperthyroidisms ?Prior history of hyperthyroidism, but she stopped methimazole due to anxiety and weight gain. Found to have hyperthyroidism in the hospital and she is back on methimazole.  ? ?Disposition: Follow up  in 3 month(s) with MD/APP  ? ? ? ?Signed, ?Ginia Rudell Ninfa Meeker, PA-C  ?04/27/2021 3:55 PM    ?[place]  ?

## 2021-04-28 ENCOUNTER — Other Ambulatory Visit (HOSPITAL_COMMUNITY)
Admission: RE | Admit: 2021-04-28 | Discharge: 2021-04-28 | Disposition: A | Discharge status: Home / Self Care | Payer: Medicare Other | Source: Ambulatory Visit | Attending: Medical | Admitting: Medical

## 2021-04-28 ENCOUNTER — Encounter (HOSPITAL_COMMUNITY)
Admission: RE | Admit: 2021-04-28 | Discharge: 2021-04-28 | Disposition: A | Discharge status: Home / Self Care | Payer: Medicare Other | Source: Ambulatory Visit | Attending: Nurse Practitioner | Admitting: Nurse Practitioner

## 2021-04-28 ENCOUNTER — Other Ambulatory Visit: Payer: Self-pay

## 2021-04-28 DIAGNOSIS — E0590 Thyrotoxicosis, unspecified without thyrotoxic crisis or storm: Secondary | ICD-10-CM | POA: Diagnosis present

## 2021-04-28 DIAGNOSIS — I480 Paroxysmal atrial fibrillation: Secondary | ICD-10-CM | POA: Insufficient documentation

## 2021-04-28 LAB — CBC
HCT: 33.4 % — ABNORMAL LOW (ref 36.0–46.0)
Hemoglobin: 10.1 g/dL — ABNORMAL LOW (ref 12.0–15.0)
MCH: 26.6 pg (ref 26.0–34.0)
MCHC: 30.2 g/dL (ref 30.0–36.0)
MCV: 88.1 fL (ref 80.0–100.0)
Platelets: 250 10*3/uL (ref 150–400)
RBC: 3.79 MIL/uL — ABNORMAL LOW (ref 3.87–5.11)
RDW: 14.4 % (ref 11.5–15.5)
WBC: 6.4 10*3/uL (ref 4.0–10.5)
nRBC: 0 % (ref 0.0–0.2)

## 2021-04-28 MED ORDER — SODIUM IODIDE I-123 7.4 MBQ CAPS
300.0000 | ORAL_CAPSULE | Freq: Once | ORAL | Status: AC
  Administered 2021-04-28: 300 via ORAL

## 2021-04-29 ENCOUNTER — Encounter (HOSPITAL_COMMUNITY)
Admission: RE | Admit: 2021-04-29 | Discharge: 2021-04-29 | Disposition: A | Discharge status: Home / Self Care | Payer: Medicare Other | Source: Ambulatory Visit | Attending: Nurse Practitioner | Admitting: Nurse Practitioner

## 2021-04-29 DIAGNOSIS — E0590 Thyrotoxicosis, unspecified without thyrotoxic crisis or storm: Secondary | ICD-10-CM | POA: Insufficient documentation

## 2021-04-29 NOTE — Progress Notes (Signed)
Can you schedule her for a virtual appt for tomorrow to discuss this?  We need to move quickly with RAI ablation to prevent thyroid storm

## 2021-04-30 ENCOUNTER — Encounter: Payer: Self-pay | Admitting: Nurse Practitioner

## 2021-04-30 ENCOUNTER — Other Ambulatory Visit: Payer: Self-pay

## 2021-04-30 ENCOUNTER — Telehealth (INDEPENDENT_AMBULATORY_CARE_PROVIDER_SITE_OTHER): Payer: Medicare Other | Admitting: Nurse Practitioner

## 2021-04-30 VITALS — Ht 62.5 in

## 2021-04-30 DIAGNOSIS — E0500 Thyrotoxicosis with diffuse goiter without thyrotoxic crisis or storm: Secondary | ICD-10-CM

## 2021-04-30 DIAGNOSIS — E0590 Thyrotoxicosis, unspecified without thyrotoxic crisis or storm: Secondary | ICD-10-CM | POA: Diagnosis not present

## 2021-04-30 NOTE — Patient Instructions (Signed)
Radioiodine (I-131) Therapy for Hyperthyroidism ?Radioiodine (I-131) therapy is a treatment for an overactive thyroid gland (hyperthyroidism). The thyroid is a gland in the neck that uses iodine to help control how the body uses food (metabolism). This treatment involves swallowing a pill or liquid that contains I-131. I-131 is manufactured (synthetic) iodine that gives off radiation. After it is swallowed, the I-131 will be absorbed by the thyroid gland over the next few months. It will destroy thyroid cells and reverse hyperthyroidism. ?Tell a health care provider about: ?Any allergies you have. ?All medicines you are taking, including vitamins, herbs, eye drops, creams, and over-the-counter medicines. ?Any blood disorders you have. ?Any surgeries you have had. ?Any medical conditions you have. ?Whether you are pregnant, may be pregnant, or have gone through menopause, if this applies. ?Whether you currently have children. ?Whether you are breastfeeding. ?Whether you plan to have children in the next 2 years. ?Any contact you have with children or pregnant women. ?Your travel plans for the next 3 months. ?Whether you pass through radiation detectors for work or travel. ?What are the risks? ?Generally, this is a safe procedure. However, problems may occur, including: ?Damage to other structures or organs, such as the salivary glands. This could lead to dry mouth and loss of taste. ?Low sperm count, if this applies. This may lead to temporary infertility. ?Sore throat or neck pain. This is temporary. ?Slightly increased risk of thyroid cancer. ?Nausea or vomiting. ?What happens before the procedure? ?Staying hydrated ?Follow instructions from your health care provider about hydration, which may include: ?Up to 2 hours before the procedure - you may continue to drink clear liquids, such as water, clear fruit juice, black coffee, and plain tea. ?Eating and drinking restrictions ?Follow instructions from your health  care provider about eating and drinking restrictions. ?Follow a low-iodine diet as told by your health care provider. Check ingredients on packaged foods and drinks because there are foods that you will need to avoid while on the low-iodine diet: ?Avoid iodized table salt and foods that have iodized salt. ?Avoid seafood, seaweed, soybeans, and soy products. ?Avoid dairy products and eggs. ?Avoid the food dye Red No. 3 because it has iodine. ?Medicines ? ?Ask your health care provider about: ?Changing or stopping your regular medicines. This is especially important if you are taking diabetes medicines, blood thinners, or thyroid medicines. ?Taking over-the-counter medicines, vitamins, herbs, and supplements. ?General instructions ?Women may be asked to take a pregnancy test. ?Women who are breastfeeding should: ?Plan to stop at least 6 weeks before the procedure. ?Not go back to breastfeeding after the procedure until their health care provider approves. ?Plan to avoid contact with other people for 1 week after your treatment. Avoiding contact with children and pregnant women is especially important. To do this, plan to stay home from work, arrange child care, and sleep alone, if these things apply to you. ?Plan to drive yourself home after treatment. Do not take public transportation. If you need someone to drive you home, sit as far away from the driver as possible. ?What happens during the procedure? ?You will be given a dose of I-131 to swallow. It may be a pill or a liquid. ?Your thyroid gland will absorb the I-131 over the next 3 months. The treatment process will be complete in about 6 months. ?What happens after the procedure? ?You may need to stay in the hospital for 24 hours after your treatment. This depends on the requirements in your state. ?Follow instructions   from your health care provider about: ?How to take care of yourself after the procedure. ?How to protect others from exposure to radiation as it  leaves your body. ?Summary ?Radioiodine (I-131) therapy is a treatment for an overactive thyroid gland (hyperthyroidism). ?This treatment involves swallowing a pill or liquid that contains I-131. I-131 is manufactured iodine that gives off radiation. ?Your thyroid gland will absorb the I-131 over the next 3 months. The I-131 destroys thyroid cells and reverses hyperthyroidism. ?Follow instructions from your health care provider about how to take care of yourself and how to protect other people from exposure to radiation after the procedure. ?This information is not intended to replace advice given to you by your health care provider. Make sure you discuss any questions you have with your health care provider. ?Document Revised: 03/15/2018 Document Reviewed: 03/15/2018 ?Elsevier Patient Education ? 2022 [employer] ? ?

## 2021-04-30 NOTE — Written Directive (Addendum)
MOLECULAR IMAGING AND THERAPEUTICS WRITTEN DIRECTIVE ? ? ?PATIENT NAME: Krista Ramirez ? ?PT DOB:   [birth year] ?                                             ?MRN: [id number] ? ?--------------------------------------------------------------------------------------------------------------------- ? ? ?I-131 WHOLE THYROID THERAPY (NON-CANCER) ? ? ? ?RADIOPHARMACEUTICAL:   Iodine-131 Capsule  ? ? ?PRESCRIBED DOSE FOR ADMINISTRATION:   20 mCi (twenty milliCuries) ? ? ?ROUTE OFADMINISTRATION: PO ? ? ?DIAGNOSIS:  Hyperthyroidism, Graves disease ? ? ?REFERRING PHYSICIAN: Dr. Fransico Him ? ? ?TSH:    ?Lab Results  ?Component Value Date  ? TSH <0.005 (L) 04/20/2021  ? TSH <0.010 (L) 03/29/2021  ? ? ? ?PRIOR I-131 THERAPY (Date and Dose): ? ? ?PRIOR RADIOLOGY EXAMS (Results and Date): ?NM THYROID MULT UPTAKE W/IMAGING ? ?Result Date: 04/29/2021 ?CLINICAL DATA:  Hyperthyroidism. TSH equal 0.005. Nervousness. And increased appetite. Difficulty sleeping. Hair loss. Heart palpitations. EXAM: THYROID SCAN AND UPTAKE - 4 AND 24 HOURS TECHNIQUE: Following oral administration of I-123 capsule, anterior planar imaging was acquired at 24 hours. Thyroid uptake was calculated with a thyroid probe at 4-6 hours and 24 hours. RADIOPHARMACEUTICALS:  Three hundred uCi I-123 sodium iodide p.o. COMPARISON:  None. FINDINGS: Uniform uptake within thyroid gland.  No nodularity. 4 hour I-123 uptake = 71.8% (normal 5-20%) 24 hour I-123 uptake = 59.4% (normal 10-30%) IMPRESSION: 1. Thyroid imaging and I 123 uptake consistent with Graves disease. 2. Rapid turnover of I 123 at 4 hour uptake. Electronically Signed   By: Genevive Bi M.D.   On: 04/29/2021 16:05    ? ? ?ADDITIONAL PHYSICIAN COMMENTS/NOTES ? ? ?AUTHORIZED USER SIGNATURE & TIME STAMP: ?

## 2021-04-30 NOTE — Progress Notes (Signed)
? ? ? 04/30/2021   ? ? ?Endocrinology Follow Up Note  ? ? ?TELEHEALTH VISIT: The patient is being engaged in telehealth visit.  This type of visit limits physical examination significantly, and thus is not preferable over face-to-face encounters. ? ?I connected with  Krista Ramirez on 04/30/21 by a video enabled telemedicine application and verified that I am speaking with the correct person using two identifiers. ?  ?I discussed the limitations of evaluation and management by telemedicine. The patient expressed understanding and agreed to proceed.  ? ? ?The participants involved in this visit include: Dani GobbleWhitney J Sokhna Christoph, NP [facility] and Krista Ramirez  located at their personal residence listed. ? ?Subjective:  ? ? Patient ID: Krista Ramirez, female    DOB: [birth year], PCP Krista Ramirez. ? ? ?Past Medical History:  ?Diagnosis Date  ? COPD (chronic obstructive pulmonary disease) (HCC)   ? Hyperthyroidism   ? ? ?Past Surgical History:  ?Procedure Laterality Date  ? SKIN GRAFT    ? TUBAL LIGATION    ? ? ?Social History  ? ?Socioeconomic History  ? Marital status: Married  ?  Spouse name: Not on file  ? Number of children: Not on file  ? Years of education: Not on file  ? Highest education level: Not on file  ?Occupational History  ? Not on file  ?Tobacco Use  ? Smoking status: Former  ?  Packs/day: 1.00  ?  Years: 30.00  ?  Pack years: 30.00  ?  Types: Cigarettes  ?  Quit date: 02/22/2021  ?  Years since quitting: 0.1  ? Smokeless tobacco: Never  ?Vaping Use  ? Vaping Use: Never used  ?Substance and Sexual Activity  ? Alcohol use: Not Currently  ? Drug use: Not Currently  ? Sexual activity: Not on file  ?Other Topics Concern  ? Not on file  ?Social History Narrative  ? Not on file  ? ?Social Determinants of Health  ? ?Financial Resource Strain: Not on file  ?Food Insecurity: Not on file  ?Transportation Needs: Not on file  ?Physical Activity: Not on file  ?Stress: Not on  file  ?Social Connections: Not on file  ? ? ?Family History  ?Problem Relation Age of Onset  ? Hypertension Mother   ? ? ?Outpatient Encounter Medications as of 04/30/2021  ?Medication Sig  ? albuterol (VENTOLIN HFA) 108 (90 Base) MCG/ACT inhaler Inhale 2 puffs into the lungs every 6 (six) hours as needed for wheezing or shortness of breath.  ? apixaban (ELIQUIS) 5 MG TABS tablet Take 1 tablet (5 mg total) by mouth 2 (two) times daily.  ? citalopram (CELEXA) 10 MG tablet Take 1 tablet (10 mg total) by mouth daily.  ? diltiazem (CARDIZEM CD) 120 MG 24 hr capsule Take 1 capsule (120 mg total) by mouth daily.  ? propranolol (INDERAL) 20 MG tablet Take 1 tablet (20 mg total) by mouth 2 (two) times daily.  ? dextromethorphan-guaiFENesin (MUCINEX DM) 30-600 MG 12hr tablet Take 1 tablet by mouth 2 (two) times daily as needed for cough. (Patient not taking: Reported on 04/27/2021)  ? methimazole (TAPAZOLE) 10 MG tablet Take 1 tablet (10 mg total) by mouth 2 (two) times daily. (Patient not taking: Reported on 04/30/2021)  ? ?No facility-administered encounter medications on file as of 04/30/2021.  ? ? ?ALLERGIES: ?No Known Allergies ? ?VACCINATION STATUS: ?Immunization History  ?Administered Date(s) Administered  ? Influenza-Unspecified 12/03/2020  ? Pfizer Covid-19 Vaccine Bivalent Booster 7324yrs &  up 02/17/2019, 03/10/2019, 11/04/2019  ? ? ? ?HPI ? ?Krista Ramirez is 66 y.o. female who presents today with a medical history as above. she is being seen in follow up after being seen in consultation for hyperthyroidism requested by Krista Halon, Ramirez.  she has been dealing with symptoms of anxiety, insomnia, irritability, palpitations, weight loss, and tremors for 6-8 months. These symptoms are progressively worsening and troubling to her.  her most recent thyroid labs revealed suppressed TSH of < 0.010, high Free T3 of 26.5 and high Free T4 of 4.79 on 03/29/21. ?she denies dysphagia, choking, shortness of breath, no recent voice  change.  ?  ?she does have family history of thyroid dysfunction in her siblings, but denies family hx of thyroid cancer. she denies personal history of goiter. she is currently on Methimazole 10 mg po twice daily. Denies use of Biotin containing supplements.  she is willing to proceed with appropriate work up and therapy for thyrotoxicosis. ? ? ?Review of systems ? ?Constitutional: + recent weight loss, current Body mass index is 24.95 kg/m?., no fatigue, no subjective hyperthermia, no subjective hypothermia ?Eyes: no blurry vision, no xerophthalmia ?ENT: no sore throat, no nodules palpated in throat, no dysphagia/odynophagia, no hoarseness ?Cardiovascular: no chest pain, no shortness of breath, + palpitations, no leg swelling ?Respiratory: no cough, no shortness of breath ?Gastrointestinal: no nausea/vomiting/diarrhea ?Musculoskeletal: no muscle/joint aches ?Skin: no rashes, no hyperemia ?Neurological: + tremors, no numbness, no tingling, no dizziness ?Psychiatric: no depression, + anxiety, + insomnia, + irritability ? ? ?Objective:  ?  ?Ht 5' 2.5" (1.588 m)   BMI 24.95 kg/m?   ?Wt Readings from Last 3 Encounters:  ?04/27/21 138 lb 9.6 oz (62.9 kg)  ?04/20/21 134 lb 12.8 oz (61.1 kg)  ?04/08/21 133 lb 9.6 oz (60.6 kg)  ?  ? ?BP Readings from Last 3 Encounters:  ?04/27/21 (!) 136/58  ?04/20/21 122/72  ?04/08/21 122/64  ? ? ?   Physical Exam- Telehealth- significantly limited due to nature of visit ? ?Constitutional: Body mass index is 24.95 kg/m?. , not in acute distress, normal state of mind ?Respiratory: Adequate breathing efforts ? ? ?CMP  ?   ?Component Value Date/Time  ? NA 140 03/30/2021 0438  ? K 4.4 03/30/2021 0438  ? CL 110 03/30/2021 0438  ? CO2 21 (L) 03/30/2021 0438  ? GLUCOSE 105 (H) 03/30/2021 0438  ? BUN 28 (H) 03/30/2021 0438  ? CREATININE 0.56 03/30/2021 0438  ? CALCIUM 9.3 03/30/2021 0438  ? PROT 6.3 (L) 03/29/2021 0815  ? ALBUMIN 3.4 (L) 03/29/2021 0815  ? AST 27 03/29/2021 0815  ? ALT 23  03/29/2021 0815  ? ALKPHOS 171 (H) 03/29/2021 0815  ? BILITOT 0.7 03/29/2021 0815  ? GFRNONAA >60 03/30/2021 0438  ? ? ? ?CBC ?   ?Component Value Date/Time  ? WBC 6.4 04/28/2021 0806  ? RBC 3.79 (L) 04/28/2021 0806  ? HGB 10.1 (L) 04/28/2021 0806  ? HCT 33.4 (L) 04/28/2021 0806  ? PLT 250 04/28/2021 0806  ? MCV 88.1 04/28/2021 0806  ? MCH 26.6 04/28/2021 0806  ? MCHC 30.2 04/28/2021 0806  ? RDW 14.4 04/28/2021 0806  ? ? ? ?Diabetic Labs (most recent): ?No results found for: HGBA1C ? ?Lipid Panel  ?No results found for: CHOL, TRIG, HDL, CHOLHDL, VLDL, LDLCALC, LDLDIRECT, LABVLDL ? ? ?Lab Results  ?Component Value Date  ? TSH <0.005 (L) 04/20/2021  ? TSH <0.010 (L) 03/29/2021  ? FREET4 0.75 (L) 04/20/2021  ? FREET4  4.79 (H) 03/29/2021  ?  ? ? ? Latest Reference Range & Units 03/29/21 08:16 04/20/21 13:49  ?TSH 0.450 - 4.500 uIU/mL <0.010 (L) <0.005 (L)  ?Triiodothyronine,Free,Serum 2.0 - 4.4 pg/mL 26.5 (H) 4.2  ?T4,Free(Direct) 0.82 - 1.77 ng/dL 1.61 (H) 0.96 (L)  ?Thyroperoxidase Ab SerPl-aCnc 0 - 34 IU/mL  210 (H)  ?Thyroglobulin Antibody 0.0 - 0.9 IU/mL  1,505.7 (H)  ?(L): Data is abnormally low ?(H): Data is abnormally high ? ? ?Uptake and Scan from 04/29/21 ?CLINICAL DATA:  Hyperthyroidism. TSH equal 0.005. Nervousness. And ?increased appetite. Difficulty sleeping. Hair loss. Heart ?palpitations. ?  ?EXAM: ?THYROID SCAN AND UPTAKE - 4 AND 24 HOURS ?  ?TECHNIQUE: ?Following oral administration of I-123 capsule, anterior planar ?imaging was acquired at 24 hours. Thyroid uptake was calculated with ?a thyroid probe at 4-6 hours and 24 hours. ?  ?RADIOPHARMACEUTICALS:  Three hundred uCi I-123 sodium iodide p.o. ?  ?COMPARISON:  None. ?  ?FINDINGS: ?Uniform uptake within thyroid gland.  No nodularity. ?  ?4 hour I-123 uptake = 71.8% (normal 5-20%) ?  ?24 hour I-123 uptake = 59.4% (normal 10-30%) ?  ?IMPRESSION: ?1. Thyroid imaging and I 123 uptake consistent with Graves disease. ?2. Rapid turnover of I 123 at 4 hour  uptake. ?  ?  ?Electronically Signed ?  By: Genevive Bi M.D. ?  On: 04/29/2021 16:05 ? ? ?Assessment & Plan:  ? ?1) Hyperthyroidism-r/t Graves disease ? ?she is being seen at a kind request of Allena Katz, Dondra Prader

## 2021-04-30 NOTE — Addendum Note (Signed)
Addended by: Dani Gobble on: 04/30/2021 08:55 AM ? ? Modules accepted: Orders ? ?

## 2021-05-03 ENCOUNTER — Telehealth: Payer: Medicare Other | Admitting: Nurse Practitioner

## 2021-05-07 ENCOUNTER — Ambulatory Visit (HOSPITAL_COMMUNITY)
Admission: RE | Admit: 2021-05-07 | Discharge: 2021-05-07 | Disposition: A | Discharge status: Home / Self Care | Payer: Medicare Other | Source: Ambulatory Visit | Attending: Nurse Practitioner | Admitting: Nurse Practitioner

## 2021-05-07 ENCOUNTER — Other Ambulatory Visit: Payer: Self-pay

## 2021-05-07 DIAGNOSIS — E0500 Thyrotoxicosis with diffuse goiter without thyrotoxic crisis or storm: Secondary | ICD-10-CM | POA: Diagnosis present

## 2021-05-07 MED ORDER — SODIUM IODIDE I 131 CAPSULE
20.0000 | Freq: Once | INTRAVENOUS | Status: AC | PRN
Start: 2021-05-07 — End: 2021-05-07
  Administered 2021-05-07: 20 via ORAL

## 2021-05-10 LAB — COLOGUARD: COLOGUARD: NEGATIVE

## 2021-05-26 ENCOUNTER — Telehealth: Payer: Self-pay | Admitting: Nurse Practitioner

## 2021-05-26 ENCOUNTER — Encounter: Payer: Self-pay | Admitting: Internal Medicine

## 2021-05-26 ENCOUNTER — Telehealth: Payer: Self-pay

## 2021-05-26 ENCOUNTER — Ambulatory Visit (INDEPENDENT_AMBULATORY_CARE_PROVIDER_SITE_OTHER): Payer: Medicare Other | Admitting: Internal Medicine

## 2021-05-26 DIAGNOSIS — I4891 Unspecified atrial fibrillation: Secondary | ICD-10-CM | POA: Diagnosis not present

## 2021-05-26 MED ORDER — DILTIAZEM HCL ER COATED BEADS 120 MG PO CP24: 120.0000 mg | ORAL_CAPSULE | Freq: Every day | ORAL | 5 refills | Status: DC

## 2021-05-26 MED ORDER — APIXABAN 5 MG PO TABS: 5.0000 mg | ORAL_TABLET | Freq: Two times a day (BID) | ORAL | 5 refills | Status: DC

## 2021-05-26 NOTE — Assessment & Plan Note (Signed)
Had A-fib with RVR during recent hospital visit, likely due to uncontrolled hyperthyroidism Currently in sinus rhythm Currently on Diltiazem and Eliquis -advised to continue for now, refills provided Followed by cardiology 

## 2021-05-26 NOTE — Telephone Encounter (Signed)
Pt scheduled virtual visit to follow up with patel 05-26-21 ?

## 2021-05-26 NOTE — Progress Notes (Signed)
?  ? ?Virtual Visit via Telephone Note  ? ?This visit type was conducted due to national recommendations for restrictions regarding the COVID-19 Pandemic (e.g. social distancing) in an effort to limit this patient's exposure and mitigate transmission in our community.  Due to her co-morbid illnesses, this patient is at least at moderate risk for complications without adequate follow up.  This format is felt to be most appropriate for this patient at this time.  The patient did not have access to video technology/had technical difficulties with video requiring transitioning to audio format only (telephone).  All issues noted in this document were discussed and addressed.  No physical exam could be performed with this format. ? ?Evaluation Performed:  Follow-up visit ? ?Date:  05/26/2021  ? ?ID:  Krista Ramirez, DOB [birth year], MRN [id number] ? ?Patient Location: Home ?Provider Location: Office/Clinic ? ?Participants: Patient ?Location of Patient: Home ?Location of Provider: Telehealth ?Consent was obtain for visit to be over via telehealth. ?I verified that I am speaking with the correct person using two identifiers. ? ?PCP:  Anabel Halon, MD  ? ?Chief Complaint: Medication management ? ?History of Present Illness:   ? ?Krista Ramirez is a 66 y.o. female who has a televisit for follow-up of her atrial fibrillation.  She was unsure with whether she needs to continue taking Cardizem and Eliquis.  She was started on Cardizem and Eliquis for episode of A-fib with RVR during her hospital stay, which was likely provoked by uncontrolled hyperthyroidism.  She has seen cardiologist since then.  She currently denies any chest pain, dyspnea or palpitations.  Her BP has been well controlled at home. ? ?The patient does not have symptoms concerning for COVID-19 infection (fever, chills, cough, or new shortness of breath).  ? ?Past Medical, Surgical, Social History, Allergies, and Medications have been Reviewed. ? ?Past Medical  History:  ?Diagnosis Date  ? COPD (chronic obstructive pulmonary disease) (HCC)   ? Hyperthyroidism   ? ?Past Surgical History:  ?Procedure Laterality Date  ? SKIN GRAFT    ? TUBAL LIGATION    ?  ? ?Current Meds  ?Medication Sig  ? albuterol (VENTOLIN HFA) 108 (90 Base) MCG/ACT inhaler Inhale 2 puffs into the lungs every 6 (six) hours as needed for wheezing or shortness of breath.  ? citalopram (CELEXA) 10 MG tablet Take 1 tablet (10 mg total) by mouth daily.  ? propranolol (INDERAL) 20 MG tablet Take 1 tablet (20 mg total) by mouth 2 (two) times daily.  ? [DISCONTINUED] apixaban (ELIQUIS) 5 MG TABS tablet Take 1 tablet (5 mg total) by mouth 2 (two) times daily.  ? [DISCONTINUED] diltiazem (CARDIZEM CD) 120 MG 24 hr capsule Take 1 capsule (120 mg total) by mouth daily.  ?  ? ?Allergies:   Patient has no known allergies.  ? ?ROS:   ?Please see the history of present illness.    ? ?All other systems reviewed and are negative. ? ? ?Labs/Other Tests and Data Reviewed:   ? ?Recent Labs: ?03/29/2021: ALT 23; B Natriuretic Peptide 628.0 ?03/30/2021: BUN 28; Creatinine, Ser 0.56; Magnesium 2.0; Potassium 4.4; Sodium 140 ?04/20/2021: TSH <0.005 ?04/28/2021: Hemoglobin 10.1; Platelets 250  ? ?Recent Lipid Panel ?No results found for: CHOL, TRIG, HDL, CHOLHDL, LDLCALC, LDLDIRECT ? ?Wt Readings from Last 3 Encounters:  ?04/27/21 138 lb 9.6 oz (62.9 kg)  ?04/20/21 134 lb 12.8 oz (61.1 kg)  ?04/08/21 133 lb 9.6 oz (60.6 kg)  ?  ? ?ASSESSMENT & PLAN:   ? ?  Atrial fibrillation (HCC) ?Had A-fib with RVR during recent hospital visit, likely due to uncontrolled hyperthyroidism ?Currently in sinus rhythm ?Currently on Diltiazem and Eliquis -advised to continue for now, refills provided ?Followed by cardiology ? ? ?Time:   ?Today, I have spent 12 minutes reviewing the chart, including problem list, medications, and with the patient with telehealth technology discussing the above problems. ? ? ?Medication Adjustments/Labs and Tests  Ordered: ?Current medicines are reviewed at length with the patient today.  Concerns regarding medicines are outlined above.  ? ?Tests Ordered: ?No orders of the defined types were placed in this encounter. ? ? ?Medication Changes: ?Meds ordered this encounter  ?Medications  ? apixaban (ELIQUIS) 5 MG TABS tablet  ?  Sig: Take 1 tablet (5 mg total) by mouth 2 (two) times daily.  ?  Dispense:  60 tablet  ?  Refill:  5  ? diltiazem (CARDIZEM CD) 120 MG 24 hr capsule  ?  Sig: Take 1 capsule (120 mg total) by mouth daily.  ?  Dispense:  30 capsule  ?  Refill:  5  ? ? ? ?Note: This dictation was prepared with Dragon dictation along with smaller phrase technology. Similar sounding words can be transcribed inadequately or may not be corrected upon review. Any transcriptional errors that result from this process are unintentional.  ?  ? ? ?Disposition:  Follow up  ?Signed, ?Anabel Halon, MD  ?05/26/2021 4:28 PM    ? ?[facility] ?[facility] ?

## 2021-05-26 NOTE — Telephone Encounter (Signed)
Patient is requesting a refill on her Eliquis and her Cardizem. I told her I was not sure if you were going to prescribe this, she wanted me to send a message. [facility] ?

## 2021-05-26 NOTE — Telephone Encounter (Signed)
That will come from her PCP

## 2021-05-26 NOTE — Patient Instructions (Signed)
Please continue taking medications as prescribed.

## 2021-05-26 NOTE — Telephone Encounter (Signed)
Called pt, no answer.

## 2021-05-26 NOTE — Telephone Encounter (Signed)
Patient said she was discharged from the hospital on 03/30/21 and has been in for a TOC but her meds were not addressed and added.  Now she is out of meds the hospital put her on.  Can you please call her for a detail follow up on medication she should be taking and refills on medication she was put on in the hospital.  ?

## 2021-06-28 ENCOUNTER — Ambulatory Visit: Payer: Medicare Other | Admitting: Nurse Practitioner

## 2021-06-29 LAB — T3, FREE: T3, Free: 1.7 pg/mL — ABNORMAL LOW (ref 2.0–4.4)

## 2021-06-29 LAB — TSH: TSH: 0.108 u[IU]/mL — ABNORMAL LOW (ref 0.450–4.500)

## 2021-06-29 LAB — T4, FREE: Free T4: 0.38 ng/dL — ABNORMAL LOW (ref 0.82–1.77)

## 2021-07-02 ENCOUNTER — Encounter: Payer: Self-pay | Admitting: Nurse Practitioner

## 2021-07-02 ENCOUNTER — Ambulatory Visit (INDEPENDENT_AMBULATORY_CARE_PROVIDER_SITE_OTHER): Payer: Medicare Other | Admitting: Nurse Practitioner

## 2021-07-02 VITALS — BP 139/80 | HR 52 | Ht 62.5 in | Wt 144.0 lb

## 2021-07-02 DIAGNOSIS — E0590 Thyrotoxicosis, unspecified without thyrotoxic crisis or storm: Secondary | ICD-10-CM

## 2021-07-02 DIAGNOSIS — E0500 Thyrotoxicosis with diffuse goiter without thyrotoxic crisis or storm: Secondary | ICD-10-CM

## 2021-07-02 DIAGNOSIS — E890 Postprocedural hypothyroidism: Secondary | ICD-10-CM

## 2021-07-02 MED ORDER — LEVOTHYROXINE SODIUM 25 MCG PO TABS: 25.0000 ug | ORAL_TABLET | Freq: Every day | ORAL | 0 refills | Status: DC

## 2021-07-02 NOTE — Progress Notes (Signed)
07/02/2021     Endocrinology Follow Up Note     Subjective:    Patient ID: Krista Ramirez, female    DOB: Jan 18, 1956, PCP Anabel Halon, MD.   Past Medical History:  Diagnosis Date   COPD (chronic obstructive pulmonary disease) (HCC)    Hyperthyroidism     Past Surgical History:  Procedure Laterality Date   SKIN GRAFT     TUBAL LIGATION      Social History   Socioeconomic History   Marital status: Married    Spouse name: Not on file   Number of children: Not on file   Years of education: Not on file   Highest education level: Not on file  Occupational History   Not on file  Tobacco Use   Smoking status: Former    Packs/day: 1.00    Years: 30.00    Pack years: 30.00    Types: Cigarettes    Quit date: 02/22/2021    Years since quitting: 0.3   Smokeless tobacco: Never  Vaping Use   Vaping Use: Never used  Substance and Sexual Activity   Alcohol use: Not Currently   Drug use: Not Currently   Sexual activity: Not on file  Other Topics Concern   Not on file  Social History Narrative   Not on file   Social Determinants of Health   Financial Resource Strain: Not on file  Food Insecurity: Not on file  Transportation Needs: Not on file  Physical Activity: Not on file  Stress: Not on file  Social Connections: Not on file    Family History  Problem Relation Age of Onset   Hypertension Mother     Outpatient Encounter Medications as of 07/02/2021  Medication Sig   levothyroxine (SYNTHROID) 25 MCG tablet Take 1 tablet (25 mcg total) by mouth daily before breakfast.   albuterol (VENTOLIN HFA) 108 (90 Base) MCG/ACT inhaler Inhale 2 puffs into the lungs every 6 (six) hours as needed for wheezing or shortness of breath.   apixaban (ELIQUIS) 5 MG TABS tablet Take 1 tablet (5 mg total) by mouth 2 (two) times daily.   citalopram (CELEXA) 10 MG tablet Take 1 tablet (10 mg total) by mouth daily.   diltiazem (CARDIZEM CD) 120 MG 24 hr capsule Take 1 capsule  (120 mg total) by mouth daily.   propranolol (INDERAL) 20 MG tablet Take 1 tablet (20 mg total) by mouth 2 (two) times daily.   No facility-administered encounter medications on file as of 07/02/2021.    ALLERGIES: No Known Allergies  VACCINATION STATUS: Immunization History  Administered Date(s) Administered   Influenza-Unspecified 12/03/2020   Pfizer Covid-19 Vaccine Bivalent Booster 41yrs & up 02/17/2019, 03/10/2019, 11/04/2019     HPI  Krista Ramirez is 66 y.o. female who presents today with a medical history as above. she is being seen in follow up after being seen in consultation for hyperthyroidism requested by Anabel Halon, MD.  she has been dealing with symptoms of anxiety, insomnia, irritability, palpitations, weight loss, and tremors for 6-8 months. These symptoms are progressively worsening and troubling to her.  her most recent thyroid labs revealed suppressed TSH of < 0.010, high Free T3 of 26.5 and high Free T4 of 4.79 on 03/29/21. she denies dysphagia, choking, shortness of breath, no recent voice change.    she does have family history of thyroid dysfunction in her siblings, but denies family hx of thyroid cancer. she denies personal history of goiter. she is  currently on Methimazole 10 mg po twice daily. Denies use of Biotin containing supplements.  she is willing to proceed with appropriate work up and therapy for thyrotoxicosis.   Review of systems  Constitutional: + steadily increasing body weight,  current Body mass index is 25.92 kg/m. , + fatigue, no subjective hyperthermia, no subjective hypothermia, increased appetite Eyes: no blurry vision, no xerophthalmia ENT: no sore throat, no nodules palpated in throat, no dysphagia/odynophagia, no hoarseness Cardiovascular: no chest pain, no shortness of breath, no palpitations, no leg swelling Respiratory: no cough, no shortness of breath Gastrointestinal: no nausea/vomiting/diarrhea Musculoskeletal: no  muscle/joint aches Skin: no rashes, no hyperemia Neurological: no tremors, no numbness, no tingling, no dizziness Psychiatric: no depression, no anxiety   Objective:    BP 139/80   Pulse (!) 52   Ht 5' 2.5" (1.588 m)   Wt 144 lb (65.3 kg)   BMI 25.92 kg/m   Wt Readings from Last 3 Encounters:  07/02/21 144 lb (65.3 kg)  04/27/21 138 lb 9.6 oz (62.9 kg)  04/20/21 134 lb 12.8 oz (61.1 kg)     BP Readings from Last 3 Encounters:  07/02/21 139/80  04/27/21 (!) 136/58  04/20/21 122/72     Physical Exam- Limited  Constitutional:  Body mass index is 25.92 kg/m. , not in acute distress, normal state of mind Eyes:  EOMI, no exophthalmos Neck: Supple Cardiovascular: RRR, no murmurs, rubs, or gallops, no edema Respiratory: Adequate breathing efforts, no crackles, rales, rhonchi, or wheezing Musculoskeletal: no gross deformities, strength intact in all four extremities, no gross restriction of joint movements Skin:  no rashes, no hyperemia Neurological: no tremor with outstretched hands   CMP     Component Value Date/Time   NA 140 03/30/2021 0438   K 4.4 03/30/2021 0438   CL 110 03/30/2021 0438   CO2 21 (L) 03/30/2021 0438   GLUCOSE 105 (H) 03/30/2021 0438   BUN 28 (H) 03/30/2021 0438   CREATININE 0.56 03/30/2021 0438   CALCIUM 9.3 03/30/2021 0438   PROT 6.3 (L) 03/29/2021 0815   ALBUMIN 3.4 (L) 03/29/2021 0815   AST 27 03/29/2021 0815   ALT 23 03/29/2021 0815   ALKPHOS 171 (H) 03/29/2021 0815   BILITOT 0.7 03/29/2021 0815   GFRNONAA >60 03/30/2021 0438     CBC    Component Value Date/Time   WBC 6.4 04/28/2021 0806   RBC 3.79 (L) 04/28/2021 0806   HGB 10.1 (L) 04/28/2021 0806   HCT 33.4 (L) 04/28/2021 0806   PLT 250 04/28/2021 0806   MCV 88.1 04/28/2021 0806   MCH 26.6 04/28/2021 0806   MCHC 30.2 04/28/2021 0806   RDW 14.4 04/28/2021 0806     Diabetic Labs (most recent): No results found for: HGBA1C  Lipid Panel  No results found for: CHOL, TRIG,  HDL, CHOLHDL, VLDL, LDLCALC, LDLDIRECT, LABVLDL   Lab Results  Component Value Date   TSH 0.108 (L) 06/28/2021   TSH <0.005 (L) 04/20/2021   TSH <0.010 (L) 03/29/2021   FREET4 0.38 (L) 06/28/2021   FREET4 0.75 (L) 04/20/2021   FREET4 4.79 (H) 03/29/2021       Latest Reference Range & Units 03/29/21 08:16 04/20/21 13:49  TSH 0.450 - 4.500 uIU/mL <0.010 (L) <0.005 (L)  Triiodothyronine,Free,Serum 2.0 - 4.4 pg/mL 26.5 (H) 4.2  T4,Free(Direct) 0.82 - 1.77 ng/dL 1.61 (H) 0.96 (L)  Thyroperoxidase Ab SerPl-aCnc 0 - 34 IU/mL  210 (H)  Thyroglobulin Antibody 0.0 - 0.9 IU/mL  1,505.7 (H)  (  L): Data is abnormally low (H): Data is abnormally high   Uptake and Scan from 04/29/21 CLINICAL DATA:  Hyperthyroidism. TSH equal 0.005. Nervousness. And increased appetite. Difficulty sleeping. Hair loss. Heart palpitations.   EXAM: THYROID SCAN AND UPTAKE - 4 AND 24 HOURS   TECHNIQUE: Following oral administration of I-123 capsule, anterior planar imaging was acquired at 24 hours. Thyroid uptake was calculated with a thyroid probe at 4-6 hours and 24 hours.   RADIOPHARMACEUTICALS:  Three hundred uCi I-123 sodium iodide p.o.   COMPARISON:  None.   FINDINGS: Uniform uptake within thyroid gland.  No nodularity.   4 hour I-123 uptake = 71.8% (normal 5-20%)   24 hour I-123 uptake = 59.4% (normal 10-30%)   IMPRESSION: 1. Thyroid imaging and I 123 uptake consistent with Graves disease. 2. Rapid turnover of I 123 at 4 hour uptake.     Electronically Signed   By: Genevive BiStewart  Edmunds M.D.   On: 04/29/2021 16:05   Latest Reference Range & Units 03/29/21 08:16 04/20/21 13:49 06/28/21 13:27  TSH 0.450 - 4.500 uIU/mL <0.010 (L) <0.005 (L) 0.108 (L)  Triiodothyronine,Free,Serum 2.0 - 4.4 pg/mL 26.5 (H) 4.2 1.7 (L)  T4,Free(Direct) 0.82 - 1.77 ng/dL 1.614.79 (H) 0.960.75 (L) 0.450.38 (L)  Thyroperoxidase Ab SerPl-aCnc 0 - 34 IU/mL  210 (H)   Thyroglobulin Antibody 0.0 - 0.9 IU/mL  1,505.7 (H)   (L): Data  is abnormally low (H): Data is abnormally high Assessment & Plan:   1) Hypothyroidism- S/p RAI ablation for Graves Disease  she is being seen at a kind request of Anabel HalonPatel, Rutwik K, MD.  She is S/P RAI ablation on 05/07/21   Her repeat thyroid function tests show she is now ready for thyroid hormone replacement therapy.  She is advised to start Levothyroxine 25 mcg po daily before breakfast.  - The correct intake of thyroid hormone (Levothyroxine, Synthroid), is on empty stomach first thing in the morning, with water, separated by at least 30 minutes from breakfast and other medications,  and separated by more than 4 hours from calcium, iron, multivitamins, acid reflux medications (PPIs).  - This medication is a life-long medication and will be needed to correct thyroid hormone imbalances for the rest of your life.  The dose may change from time to time, based on thyroid blood work.  - It is extremely important to be consistent taking this medication, near the same time each morning.  -AVOID TAKING PRODUCTS CONTAINING BIOTIN (commonly found in Hair, Skin, Nails vitamins) AS IT INTERFERES WITH THE VALIDITY OF THYROID FUNCTION BLOOD TESTS.     -Patient is advised to maintain close follow up with Anabel HalonPatel, Rutwik K, MD for primary care needs.    I spent 20 minutes in the care of the patient today including review of labs from Thyroid Function, CMP, and other relevant labs ; imaging/biopsy records (current and previous including abstractions from other facilities); face-to-face time discussing  her lab results and symptoms, medications doses, her options of short and long term treatment based on the latest standards of care / guidelines;   and documenting the encounter.  Krista Minceelbry Schorsch  participated in the discussions, expressed understanding, and voiced agreement with the above plans.  All questions were answered to her satisfaction. she is encouraged to contact clinic should she have any  questions or concerns prior to her return visit.  Follow up plan: Return in about 8 weeks (around 08/27/2021) for Thyroid follow up, Previsit labs.   Thank you for involving  me in the care of this pleasant patient, and I will continue to update you with her progress.    Ronny Bacon, [facility] [address] [place], [place] [address] Phone: [phone] Fax: [phone]  07/02/2021, 10:31 AM

## 2021-07-02 NOTE — Patient Instructions (Signed)

## 2021-07-19 ENCOUNTER — Encounter: Payer: Self-pay | Admitting: Cardiology

## 2021-07-19 ENCOUNTER — Ambulatory Visit (INDEPENDENT_AMBULATORY_CARE_PROVIDER_SITE_OTHER): Payer: Medicare Other | Admitting: Cardiology

## 2021-07-19 DIAGNOSIS — I480 Paroxysmal atrial fibrillation: Secondary | ICD-10-CM

## 2021-07-19 DIAGNOSIS — E0590 Thyrotoxicosis, unspecified without thyrotoxic crisis or storm: Secondary | ICD-10-CM

## 2021-07-19 DIAGNOSIS — R030 Elevated blood-pressure reading, without diagnosis of hypertension: Secondary | ICD-10-CM

## 2021-07-19 NOTE — Assessment & Plan Note (Signed)
Monitor.  Usually her blood pressures in the 1 [id number] 30 systolic.  No changes made today.  On diltiazem 120.

## 2021-07-19 NOTE — Patient Instructions (Addendum)
Medication Instructions:   Your physician recommends that you continue on your current medications as directed. Please refer to the Current Medication list given to you today.  Labwork: None  Testing/Procedures: None  Follow-Up: Follow up in 6 months with APP for A. Fib  Any Other Special Instructions Will Be Listed Below (If Applicable).     If you need a refill on your cardiac medications before your next appointment, please call your pharmacy.

## 2021-07-19 NOTE — Assessment & Plan Note (Signed)
Radioactive iodine ablation took place.  Now off of methimazole.  Taking low-dose Synthroid 25 mcg.  Followed by endocrinology.  Weight gain noted.

## 2021-07-19 NOTE — Assessment & Plan Note (Signed)
Paroxysmal A-fib.  No further recurrence.  May have been secondary to hyperthyroidism.  For now, continue with Eliquis 5 mg twice a day in case of recurrence.  Also continue with diltiazem 120 mg a day.

## 2021-07-19 NOTE — Progress Notes (Signed)
Cardiology Office Note:    Date:  07/19/2021   ID:  Krista Ramirez, DOB August 16, 1955, MRN [id number]  PCP:  Anabel HalonPatel, Rutwik K, MD   [facility] Providers Cardiologist:  None     Referring MD: Anabel HalonPatel, Rutwik K, MD    History of Present Illness:    Krista MinceDelbry Goering is a 66 y.o. female here for follow-up of atrial fibrillation paroxysmal who converted on IV diltiazem in 2/23. Started on Eliquis. Echo showed LVEF 60-65%, no WMA, mildly dilated left atrium, mild MR, mild AI. She was found to have hyperthyroidism, which she previously had, but stopped methimazole due to anxiety and weight gain. She was started on methimazole.   Since then she had radioactive iodine thyroid ablation. Down to 118 pounds. Now 149.  Overall seems to be doing well without any palpitations no chest pain no shortness of breath.  She is here with her husband Greggory StallionGeorge.  Past Medical History:  Diagnosis Date   COPD (chronic obstructive pulmonary disease) (HCC)    Hyperthyroidism     Past Surgical History:  Procedure Laterality Date   SKIN GRAFT     TUBAL LIGATION      Current Medications: Current Meds  Medication Sig   albuterol (VENTOLIN HFA) 108 (90 Base) MCG/ACT inhaler Inhale 2 puffs into the lungs every 6 (six) hours as needed for wheezing or shortness of breath.   apixaban (ELIQUIS) 5 MG TABS tablet Take 1 tablet (5 mg total) by mouth 2 (two) times daily.   citalopram (CELEXA) 10 MG tablet Take 1 tablet (10 mg total) by mouth daily.   diltiazem (CARDIZEM CD) 120 MG 24 hr capsule Take 1 capsule (120 mg total) by mouth daily.   levothyroxine (SYNTHROID) 25 MCG tablet Take 1 tablet (25 mcg total) by mouth daily before breakfast.     Allergies:   Patient has no known allergies.   Social History   Socioeconomic History   Marital status: Married    Spouse name: Not on file   Number of children: Not on file   Years of education: Not on file   Highest education level: Not on file  Occupational History    Not on file  Tobacco Use   Smoking status: Former    Packs/day: 1.00    Years: 30.00    Pack years: 30.00    Types: Cigarettes    Quit date: 02/22/2021    Years since quitting: 0.4   Smokeless tobacco: Never  Vaping Use   Vaping Use: Never used  Substance and Sexual Activity   Alcohol use: Not Currently   Drug use: Not Currently   Sexual activity: Not on file  Other Topics Concern   Not on file  Social History Narrative   Not on file   Social Determinants of Health   Financial Resource Strain: Not on file  Food Insecurity: Not on file  Transportation Needs: Not on file  Physical Activity: Not on file  Stress: Not on file  Social Connections: Not on file     Family History: The patient's family history includes Hypertension in her mother.  ROS:   Please see the history of present illness.     All other systems reviewed and are negative.  EKGs/Labs/Other Studies Reviewed:    The following studies were reviewed today: Echo 03/2021 1. Left ventricular ejection fraction, by estimation, is 60 to 65%. The  left ventricle has normal function. The left ventricle has no regional  wall motion abnormalities. Left ventricular diastolic  parameters are  indeterminate.   2. Right ventricular systolic function is normal. The right ventricular  size is normal. There is normal pulmonary artery systolic pressure.   3. Left atrial size was mildly dilated.   4. The mitral valve is normal in structure. Mild mitral valve  regurgitation. No evidence of mitral stenosis.   5. The aortic valve is tricuspid. Aortic valve regurgitation is mild. No  aortic stenosis is present.   6. The inferior vena cava is normal in size with greater than 50%  respiratory variability, suggesting right atrial pressure of 3 mmHg.     EKG:  The prior ekg demonstrates SB 57bpm, biatrial enlargement, nonspecific T wave changes.  Recent Labs: 03/29/2021: ALT 23; B Natriuretic Peptide 628.0 03/30/2021: BUN 28;  Creatinine, Ser 0.56; Magnesium 2.0; Potassium 4.4; Sodium 140 04/28/2021: Hemoglobin 10.1; Platelets 250 06/28/2021: TSH 0.108  Recent Lipid Panel No results found for: CHOL, TRIG, HDL, CHOLHDL, VLDL, LDLCALC, LDLDIRECT   Risk Assessment/Calculations:              Physical Exam:    VS:  BP (!) 158/88 Comment: Pt states she is nervous/had bbq for lunch  Pulse 70   Ht 5' 2.5" (1.588 m)   Wt 149 lb 3.2 oz (67.7 kg)   SpO2 99%   BMI 26.85 kg/m     Wt Readings from Last 3 Encounters:  07/19/21 149 lb 3.2 oz (67.7 kg)  07/02/21 144 lb (65.3 kg)  04/27/21 138 lb 9.6 oz (62.9 kg)     GEN:  Well nourished, well developed in no acute distress HEENT: Normal NECK: No JVD; No carotid bruits LYMPHATICS: No lymphadenopathy CARDIAC: RRR, no murmurs, no rubs, gallops RESPIRATORY:  Clear to auscultation without rales, wheezing or rhonchi  ABDOMEN: Soft, non-tender, non-distended MUSCULOSKELETAL:  No edema; No deformity  SKIN: Warm and dry NEUROLOGIC:  Alert and oriented x 3 PSYCHIATRIC:  Normal affect   ASSESSMENT:    1. Paroxysmal atrial fibrillation (HCC)   2. Hyperthyroidism   3. Elevated blood pressure reading    PLAN:    In order of problems listed above:  Atrial fibrillation (HCC) Paroxysmal A-fib.  No further recurrence.  May have been secondary to hyperthyroidism.  For now, continue with Eliquis 5 mg twice a day in case of recurrence.  Also continue with diltiazem 120 mg a day.  Hyperthyroidism Radioactive iodine ablation took place.  Now off of methimazole.  Taking low-dose Synthroid 25 mcg.  Followed by endocrinology.  Weight gain noted.  Elevated blood pressure reading Monitor.  Usually her blood pressures in the 1 20-1 30 systolic.  No changes made today.  On diltiazem 120.         Medication Adjustments/Labs and Tests Ordered: Current medicines are reviewed at length with the patient today.  Concerns regarding medicines are outlined above.  No orders of  the defined types were placed in this encounter.  No orders of the defined types were placed in this encounter.   Patient Instructions  Medication Instructions:   Your physician recommends that you continue on your current medications as directed. Please refer to the Current Medication list given to you today.  Labwork: None  Testing/Procedures: None  Follow-Up: Follow up in 6 months with APP for A. Fib  Any Other Special Instructions Will Be Listed Below (If Applicable).     If you need a refill on your cardiac medications before your next appointment, please call your pharmacy.    Signed, Donato Schultz,  MD  07/19/2021 2:29 PM    [facility]

## 2021-08-09 ENCOUNTER — Ambulatory Visit (INDEPENDENT_AMBULATORY_CARE_PROVIDER_SITE_OTHER): Payer: Medicare Other | Admitting: Internal Medicine

## 2021-08-09 ENCOUNTER — Encounter: Payer: Self-pay | Admitting: Internal Medicine

## 2021-08-09 VITALS — BP 132/80 | HR 62 | Ht 62.5 in | Wt 156.8 lb

## 2021-08-09 DIAGNOSIS — E890 Postprocedural hypothyroidism: Secondary | ICD-10-CM | POA: Diagnosis not present

## 2021-08-09 DIAGNOSIS — I480 Paroxysmal atrial fibrillation: Secondary | ICD-10-CM | POA: Diagnosis not present

## 2021-08-09 DIAGNOSIS — I700 Atherosclerosis of aorta: Secondary | ICD-10-CM

## 2021-08-09 DIAGNOSIS — J439 Emphysema, unspecified: Secondary | ICD-10-CM

## 2021-08-09 DIAGNOSIS — Z1231 Encounter for screening mammogram for malignant neoplasm of breast: Secondary | ICD-10-CM

## 2021-08-09 DIAGNOSIS — F411 Generalized anxiety disorder: Secondary | ICD-10-CM

## 2021-08-09 DIAGNOSIS — Z780 Asymptomatic menopausal state: Secondary | ICD-10-CM

## 2021-08-09 MED ORDER — CITALOPRAM HYDROBROMIDE 10 MG PO TABS: 10.0000 mg | ORAL_TABLET | Freq: Every day | ORAL | 5 refills | Status: DC

## 2021-08-19 ENCOUNTER — Ambulatory Visit (HOSPITAL_COMMUNITY): Payer: Medicare Other

## 2021-08-26 LAB — TSH: TSH: <0.005 u[IU]/mL — ABNORMAL LOW (ref 0.450–4.500)

## 2021-08-26 LAB — T4, FREE: Free T4: 1.66 ng/dL (ref 0.82–1.77)

## 2021-08-30 ENCOUNTER — Encounter: Payer: Self-pay | Admitting: Nurse Practitioner

## 2021-08-30 ENCOUNTER — Ambulatory Visit (INDEPENDENT_AMBULATORY_CARE_PROVIDER_SITE_OTHER): Payer: Medicare Other | Admitting: Nurse Practitioner

## 2021-08-30 VITALS — BP 130/75 | HR 67 | Ht 62.5 in | Wt 155.0 lb

## 2021-08-30 DIAGNOSIS — E890 Postprocedural hypothyroidism: Secondary | ICD-10-CM | POA: Diagnosis not present

## 2021-08-30 MED ORDER — LEVOTHYROXINE SODIUM 25 MCG PO TABS: 25.0000 ug | ORAL_TABLET | Freq: Every day | ORAL | 0 refills | Status: DC

## 2021-08-30 NOTE — Progress Notes (Signed)
08/30/2021     Endocrinology Follow Up Note     Subjective:    Patient ID: Krista Ramirez, female    DOB: Dec 24, 1955, PCP Anabel Halon, MD.   Past Medical History:  Diagnosis Date   COPD (chronic obstructive pulmonary disease) (HCC)    Hyperthyroidism     Past Surgical History:  Procedure Laterality Date   SKIN GRAFT     TUBAL LIGATION      Social History   Socioeconomic History   Marital status: Married    Spouse name: Not on file   Number of children: Not on file   Years of education: Not on file   Highest education level: Not on file  Occupational History   Not on file  Tobacco Use   Smoking status: Former    Packs/day: 1.00    Years: 30.00    Total pack years: 30.00    Types: Cigarettes    Quit date: 02/22/2021    Years since quitting: 0.5   Smokeless tobacco: Never  Vaping Use   Vaping Use: Never used  Substance and Sexual Activity   Alcohol use: Not Currently   Drug use: Not Currently   Sexual activity: Not on file  Other Topics Concern   Not on file  Social History Narrative   Not on file   Social Determinants of Health   Financial Resource Strain: Not on file  Food Insecurity: Not on file  Transportation Needs: Not on file  Physical Activity: Not on file  Stress: Not on file  Social Connections: Not on file    Family History  Problem Relation Age of Onset   Hypertension Mother     Outpatient Encounter Medications as of 08/30/2021  Medication Sig   albuterol (VENTOLIN HFA) 108 (90 Base) MCG/ACT inhaler Inhale 2 puffs into the lungs every 6 (six) hours as needed for wheezing or shortness of breath.   apixaban (ELIQUIS) 5 MG TABS tablet Take 1 tablet (5 mg total) by mouth 2 (two) times daily.   citalopram (CELEXA) 10 MG tablet Take 1 tablet (10 mg total) by mouth daily.   diltiazem (CARDIZEM CD) 120 MG 24 hr capsule Take 1 capsule (120 mg total) by mouth daily.   levothyroxine (SYNTHROID) 25 MCG tablet Take 1 tablet (25 mcg  total) by mouth daily before breakfast.   No facility-administered encounter medications on file as of 08/30/2021.    ALLERGIES: No Known Allergies  VACCINATION STATUS: Immunization History  Administered Date(s) Administered   Influenza-Unspecified 12/03/2020   Pfizer Covid-19 Vaccine Bivalent Booster 81yrs & up 02/17/2019, 03/10/2019, 11/04/2019     HPI  Krista Ramirez is 66 y.o. female who presents today with a medical history as above. she is being seen in follow up after being seen in consultation for hyperthyroidism requested by Anabel Halon, MD.  she has been dealing with symptoms of anxiety, insomnia, irritability, palpitations, weight loss, and tremors for 6-8 months. These symptoms are progressively worsening and troubling to her.  her most recent thyroid labs revealed suppressed TSH of < 0.010, high Free T3 of 26.5 and high Free T4 of 4.79 on 03/29/21. she denies dysphagia, choking, shortness of breath, no recent voice change.    she does have family history of thyroid dysfunction in her siblings, but denies family hx of thyroid cancer. she denies personal history of goiter. she is currently on Methimazole 10 mg po twice daily. Denies use of Biotin containing supplements.  she is willing  to proceed with appropriate work up and therapy for thyrotoxicosis.   Review of systems  Constitutional: + steadily increasing body weight,  current Body mass index is 27.9 kg/m. , + fatigue, no subjective hyperthermia, no subjective hypothermia, increased appetite Eyes: no blurry vision, no xerophthalmia ENT: no sore throat, no nodules palpated in throat, no dysphagia/odynophagia, no hoarseness Cardiovascular: no chest pain, no shortness of breath, no palpitations, no leg swelling Respiratory: no cough, no shortness of breath Gastrointestinal: no nausea/vomiting/diarrhea Musculoskeletal: no muscle/joint aches Skin: no rashes, no hyperemia Neurological: no tremors, no numbness, no  tingling, no dizziness Psychiatric: no depression, no anxiety   Objective:    BP 130/75   Pulse 67   Ht 5' 2.5" (1.588 m)   Wt 155 lb (70.3 kg)   BMI 27.90 kg/m   Wt Readings from Last 3 Encounters:  08/30/21 155 lb (70.3 kg)  08/09/21 156 lb 12.8 oz (71.1 kg)  07/19/21 149 lb 3.2 oz (67.7 kg)     BP Readings from Last 3 Encounters:  08/30/21 130/75  08/09/21 132/80  07/19/21 (!) 158/88     Physical Exam- Limited  Constitutional:  Body mass index is 27.9 kg/m. , not in acute distress, normal state of mind Eyes:  EOMI, no exophthalmos Neck: Supple Cardiovascular: RRR, no murmurs, rubs, or gallops, no edema Respiratory: Adequate breathing efforts, no crackles, rales, rhonchi, or wheezing Musculoskeletal: no gross deformities, strength intact in all four extremities, no gross restriction of joint movements Skin:  no rashes, no hyperemia Neurological: no tremor with outstretched hands   CMP     Component Value Date/Time   NA 140 03/30/2021 0438   K 4.4 03/30/2021 0438   CL 110 03/30/2021 0438   CO2 21 (L) 03/30/2021 0438   GLUCOSE 105 (H) 03/30/2021 0438   BUN 28 (H) 03/30/2021 0438   CREATININE 0.56 03/30/2021 0438   CALCIUM 9.3 03/30/2021 0438   PROT 6.3 (L) 03/29/2021 0815   ALBUMIN 3.4 (L) 03/29/2021 0815   AST 27 03/29/2021 0815   ALT 23 03/29/2021 0815   ALKPHOS 171 (H) 03/29/2021 0815   BILITOT 0.7 03/29/2021 0815   GFRNONAA >60 03/30/2021 0438     CBC    Component Value Date/Time   WBC 6.4 04/28/2021 0806   RBC 3.79 (L) 04/28/2021 0806   HGB 10.1 (L) 04/28/2021 0806   HCT 33.4 (L) 04/28/2021 0806   PLT 250 04/28/2021 0806   MCV 88.1 04/28/2021 0806   MCH 26.6 04/28/2021 0806   MCHC 30.2 04/28/2021 0806   RDW 14.4 04/28/2021 0806     Diabetic Labs (most recent): No results found for: "HGBA1C", "MICROALBUR"  Lipid Panel  No results found for: "CHOL", "TRIG", "HDL", "CHOLHDL", "VLDL", "LDLCALC", "LDLDIRECT", "LABVLDL"   Lab Results   Component Value Date   TSH <0.005 (L) 08/25/2021   TSH 0.108 (L) 06/28/2021   TSH <0.005 (L) 04/20/2021   TSH <0.010 (L) 03/29/2021   FREET4 1.66 08/25/2021   FREET4 0.38 (L) 06/28/2021   FREET4 0.75 (L) 04/20/2021   FREET4 4.79 (H) 03/29/2021       Latest Reference Range & Units 03/29/21 08:16 04/20/21 13:49  TSH 0.450 - 4.500 uIU/mL <0.010 (L) <0.005 (L)  Triiodothyronine,Free,Serum 2.0 - 4.4 pg/mL 26.5 (H) 4.2  T4,Free(Direct) 0.82 - 1.77 ng/dL 1.47 (H) 8.29 (L)  Thyroperoxidase Ab SerPl-aCnc 0 - 34 IU/mL  210 (H)  Thyroglobulin Antibody 0.0 - 0.9 IU/mL  1,505.7 (H)  (L): Data is abnormally low (H): Data  is abnormally high   Uptake and Scan from 04/29/21 CLINICAL DATA:  Hyperthyroidism. TSH equal 0.005. Nervousness. And increased appetite. Difficulty sleeping. Hair loss. Heart palpitations.   EXAM: THYROID SCAN AND UPTAKE - 4 AND 24 HOURS   TECHNIQUE: Following oral administration of I-123 capsule, anterior planar imaging was acquired at 24 hours. Thyroid uptake was calculated with a thyroid probe at 4-6 hours and 24 hours.   RADIOPHARMACEUTICALS:  Three hundred uCi I-123 sodium iodide p.o.   COMPARISON:  None.   FINDINGS: Uniform uptake within thyroid gland.  No nodularity.   4 hour I-123 uptake = 71.8% (normal 5-20%)   24 hour I-123 uptake = 59.4% (normal 10-30%)   IMPRESSION: 1. Thyroid imaging and I 123 uptake consistent with Graves disease. 2. Rapid turnover of I 123 at 4 hour uptake.     Electronically Signed   By: Genevive Bi M.D.   On: 04/29/2021 16:05   Latest Reference Range & Units 03/29/21 08:16 04/20/21 13:49 06/28/21 13:27  TSH 0.450 - 4.500 uIU/mL <0.010 (L) <0.005 (L) 0.108 (L)  Triiodothyronine,Free,Serum 2.0 - 4.4 pg/mL 26.5 (H) 4.2 1.7 (L)  T4,Free(Direct) 0.82 - 1.77 ng/dL 1.61 (H) 0.96 (L) 0.45 (L)  Thyroperoxidase Ab SerPl-aCnc 0 - 34 IU/mL  210 (H)   Thyroglobulin Antibody 0.0 - 0.9 IU/mL  1,505.7 (H)   (L): Data is  abnormally low (H): Data is abnormally high Assessment & Plan:   1) Hypothyroidism- S/p RAI ablation for Graves Disease  she is being seen at a kind request of Anabel Halon, MD.  She is S/P RAI ablation on 05/07/21   Her repeat thyroid function tests show she is now ready for thyroid hormone replacement therapy.  She is advised to start Levothyroxine 25 mcg po daily before breakfast.  - The correct intake of thyroid hormone (Levothyroxine, Synthroid), is on empty stomach first thing in the morning, with water, separated by at least 30 minutes from breakfast and other medications,  and separated by more than 4 hours from calcium, iron, multivitamins, acid reflux medications (PPIs).  - This medication is a life-long medication and will be needed to correct thyroid hormone imbalances for the rest of your life.  The dose may change from time to time, based on thyroid blood work.  - It is extremely important to be consistent taking this medication, near the same time each morning.  -AVOID TAKING PRODUCTS CONTAINING BIOTIN (commonly found in Hair, Skin, Nails vitamins) AS IT INTERFERES WITH THE VALIDITY OF THYROID FUNCTION BLOOD TESTS.     -Patient is advised to maintain close follow up with Anabel Halon, MD for primary care needs.    I spent 20 minutes in the care of the patient today including review of labs from Thyroid Function, CMP, and other relevant labs ; imaging/biopsy records (current and previous including abstractions from other facilities); face-to-face time discussing  her lab results and symptoms, medications doses, her options of short and long term treatment based on the latest standards of care / guidelines;   and documenting the encounter.  Krista Ramirez  participated in the discussions, expressed understanding, and voiced agreement with the above plans.  All questions were answered to her satisfaction. she is encouraged to contact clinic should she have any questions  or concerns prior to her return visit.  Follow up plan: No follow-ups on file.   Thank you for involving me in the care of this pleasant patient, and I will continue to update you with  her progress.    Ronny Bacon, [facility] [address] [place], [place] [address] Phone: [phone] Fax: [phone]  08/30/2021, 3:15 PM

## 2021-08-30 NOTE — Progress Notes (Signed)
08/30/2021     Endocrinology Follow Up Note     Subjective:    Patient ID: Krista Ramirez, female    DOB: 10-Aug-1955, PCP Krista Halon, MD.   Past Medical History:  Diagnosis Date   COPD (chronic obstructive pulmonary disease) (HCC)    Hyperthyroidism     Past Surgical History:  Procedure Laterality Date   SKIN GRAFT     TUBAL LIGATION      Social History   Socioeconomic History   Marital status: Married    Spouse name: Not on file   Number of children: Not on file   Years of education: Not on file   Highest education level: Not on file  Occupational History   Not on file  Tobacco Use   Smoking status: Former    Packs/day: 1.00    Years: 30.00    Total pack years: 30.00    Types: Cigarettes    Quit date: 02/22/2021    Years since quitting: 0.5   Smokeless tobacco: Never  Vaping Use   Vaping Use: Never used  Substance and Sexual Activity   Alcohol use: Not Currently   Drug use: Not Currently   Sexual activity: Not on file  Other Topics Concern   Not on file  Social History Narrative   Not on file   Social Determinants of Health   Financial Resource Strain: Not on file  Food Insecurity: Not on file  Transportation Needs: Not on file  Physical Activity: Not on file  Stress: Not on file  Social Connections: Not on file    Family History  Problem Relation Age of Onset   Hypertension Mother     Outpatient Encounter Medications as of 08/30/2021  Medication Sig   albuterol (VENTOLIN HFA) 108 (90 Base) MCG/ACT inhaler Inhale 2 puffs into the lungs every 6 (six) hours as needed for wheezing or shortness of breath.   apixaban (ELIQUIS) 5 MG TABS tablet Take 1 tablet (5 mg total) by mouth 2 (two) times daily.   citalopram (CELEXA) 10 MG tablet Take 1 tablet (10 mg total) by mouth daily.   diltiazem (CARDIZEM CD) 120 MG 24 hr capsule Take 1 capsule (120 mg total) by mouth daily.   levothyroxine (SYNTHROID) 25 MCG tablet Take 1 tablet (25 mcg  total) by mouth daily before breakfast.   [DISCONTINUED] levothyroxine (SYNTHROID) 25 MCG tablet Take 1 tablet (25 mcg total) by mouth daily before breakfast.   No facility-administered encounter medications on file as of 08/30/2021.    ALLERGIES: No Known Allergies  VACCINATION STATUS: Immunization History  Administered Date(s) Administered   Influenza-Unspecified 12/03/2020   Pfizer Covid-19 Vaccine Bivalent Booster 61yrs & up 02/17/2019, 03/10/2019, 11/04/2019     HPI  Krista Ramirez is 66 y.o. female who presents today with a medical history as above. she is being seen in follow up after being seen in consultation for hyperthyroidism requested by Krista Halon, MD.  she has been dealing with symptoms of anxiety, insomnia, irritability, palpitations, weight loss, and tremors for 6-8 months. These symptoms are progressively worsening and troubling to her.  her most recent thyroid labs revealed suppressed TSH of < 0.010, high Free T3 of 26.5 and high Free T4 of 4.79 on 03/29/21. she denies dysphagia, choking, shortness of breath, no recent voice change.    she does have family history of thyroid dysfunction in her siblings, but denies family hx of thyroid cancer. she denies personal history of goiter. she  is currently on Methimazole 10 mg po twice daily. Denies use of Biotin containing supplements.  she is willing to proceed with appropriate work up and therapy for thyrotoxicosis.   Review of systems  Constitutional: + Minimally fluctuating body weight,  current Body mass index is 27.9 kg/m. , no fatigue, no subjective hyperthermia, no subjective hypothermia Eyes: no blurry vision, no xerophthalmia ENT: no sore throat, no nodules palpated in throat, no dysphagia/odynophagia, no hoarseness Cardiovascular: no chest pain, no shortness of breath, no palpitations, no leg swelling Respiratory: no cough, no shortness of breath Gastrointestinal: no nausea/vomiting/diarrhea Musculoskeletal:  no muscle/joint aches Skin: no rashes, no hyperemia Neurological: no tremors, no numbness, no tingling, no dizziness Psychiatric: no depression, no anxiety   Objective:    BP 130/75   Pulse 67   Ht 5' 2.5" (1.588 m)   Wt 155 lb (70.3 kg)   BMI 27.90 kg/m   Wt Readings from Last 3 Encounters:  08/30/21 155 lb (70.3 kg)  08/09/21 156 lb 12.8 oz (71.1 kg)  07/19/21 149 lb 3.2 oz (67.7 kg)     BP Readings from Last 3 Encounters:  08/30/21 130/75  08/09/21 132/80  07/19/21 (!) 158/88     Physical Exam- Limited  Constitutional:  Body mass index is 27.9 kg/m. , not in acute distress, normal state of mind Eyes:  EOMI, no exophthalmos Neck: Supple Cardiovascular: RRR, no murmurs, rubs, or gallops, no edema Respiratory: Adequate breathing efforts, no crackles, rales, rhonchi, or wheezing Musculoskeletal: no gross deformities, strength intact in all four extremities, no gross restriction of joint movements Skin:  no rashes, no hyperemia Neurological: no tremor with outstretched hands   CMP     Component Value Date/Time   NA 140 03/30/2021 0438   K 4.4 03/30/2021 0438   CL 110 03/30/2021 0438   CO2 21 (L) 03/30/2021 0438   GLUCOSE 105 (H) 03/30/2021 0438   BUN 28 (H) 03/30/2021 0438   CREATININE 0.56 03/30/2021 0438   CALCIUM 9.3 03/30/2021 0438   PROT 6.3 (L) 03/29/2021 0815   ALBUMIN 3.4 (L) 03/29/2021 0815   AST 27 03/29/2021 0815   ALT 23 03/29/2021 0815   ALKPHOS 171 (H) 03/29/2021 0815   BILITOT 0.7 03/29/2021 0815   GFRNONAA >60 03/30/2021 0438     CBC    Component Value Date/Time   WBC 6.4 04/28/2021 0806   RBC 3.79 (L) 04/28/2021 0806   HGB 10.1 (L) 04/28/2021 0806   HCT 33.4 (L) 04/28/2021 0806   PLT 250 04/28/2021 0806   MCV 88.1 04/28/2021 0806   MCH 26.6 04/28/2021 0806   MCHC 30.2 04/28/2021 0806   RDW 14.4 04/28/2021 0806     Diabetic Labs (most recent): No results found for: "HGBA1C", "MICROALBUR"  Lipid Panel  No results found for:  "CHOL", "TRIG", "HDL", "CHOLHDL", "VLDL", "LDLCALC", "LDLDIRECT", "LABVLDL"   Lab Results  Component Value Date   TSH <0.005 (L) 08/25/2021   TSH 0.108 (L) 06/28/2021   TSH <0.005 (L) 04/20/2021   TSH <0.010 (L) 03/29/2021   FREET4 1.66 08/25/2021   FREET4 0.38 (L) 06/28/2021   FREET4 0.75 (L) 04/20/2021   FREET4 4.79 (H) 03/29/2021       Latest Reference Range & Units 03/29/21 08:16 04/20/21 13:49  TSH 0.450 - 4.500 uIU/mL <0.010 (L) <0.005 (L)  Triiodothyronine,Free,Serum 2.0 - 4.4 pg/mL 26.5 (H) 4.2  T4,Free(Direct) 0.82 - 1.77 ng/dL 7.10 (H) 6.26 (L)  Thyroperoxidase Ab SerPl-aCnc 0 - 34 IU/mL  210 (H)  Thyroglobulin Antibody 0.0 - 0.9 IU/mL  1,505.7 (H)  (L): Data is abnormally low (H): Data is abnormally high   Uptake and Scan from 04/29/21 CLINICAL DATA:  Hyperthyroidism. TSH equal 0.005. Nervousness. And increased appetite. Difficulty sleeping. Hair loss. Heart palpitations.   EXAM: THYROID SCAN AND UPTAKE - 4 AND 24 HOURS   TECHNIQUE: Following oral administration of I-123 capsule, anterior planar imaging was acquired at 24 hours. Thyroid uptake was calculated with a thyroid probe at 4-6 hours and 24 hours.   RADIOPHARMACEUTICALS:  Three hundred uCi I-123 sodium iodide p.o.   COMPARISON:  None.   FINDINGS: Uniform uptake within thyroid gland.  No nodularity.   4 hour I-123 uptake = 71.8% (normal 5-20%)   24 hour I-123 uptake = 59.4% (normal 10-30%)   IMPRESSION: 1. Thyroid imaging and I 123 uptake consistent with Graves disease. 2. Rapid turnover of I 123 at 4 hour uptake.     Electronically Signed   By: Genevive Bi M.D.   On: 04/29/2021 16:05   Latest Reference Range & Units 03/29/21 08:16 04/20/21 13:49 06/28/21 13:27 08/25/21 08:12  TSH 0.450 - 4.500 uIU/mL <0.010 (L) <0.005 (L) 0.108 (L) <0.005 (L)  Triiodothyronine,Free,Serum 2.0 - 4.4 pg/mL 26.5 (H) 4.2 1.7 (L)   T4,Free(Direct) 0.82 - 1.77 ng/dL 3.71 (H) 6.96 (L) 7.89 (L) 1.66   Thyroperoxidase Ab SerPl-aCnc 0 - 34 IU/mL  210 (H)    Thyroglobulin Antibody 0.0 - 0.9 IU/mL  1,505.7 (H)    (L): Data is abnormally low (H): Data is abnormally high Assessment & Plan:   1) Hypothyroidism- S/p RAI ablation for Graves Disease  she is being seen at a kind request of Krista Halon, MD.  She is S/P RAI ablation on 05/07/21   Her repeat thyroid function tests are consistent with appropriate hormone replacement.  She is advised to continue Levothyroxine 25 mcg po daily before breakfast.  Will recheck TFTs in 2 months to assess need for dosage adjustment.  She asks about whether or not she needs to stay on the Cardizem. I looked back at cardiology note, they recommend continuing it for now.  Her HR is normal today, will consider reducing/discontinuing at next visit if HR still stable and TSH closer to goal.   - The correct intake of thyroid hormone (Levothyroxine, Synthroid), is on empty stomach first thing in the morning, with water, separated by at least 30 minutes from breakfast and other medications,  and separated by more than 4 hours from calcium, iron, multivitamins, acid reflux medications (PPIs).  - This medication is a life-long medication and will be needed to correct thyroid hormone imbalances for the rest of your life.  The dose may change from time to time, based on thyroid blood work.  - It is extremely important to be consistent taking this medication, near the same time each morning.  -AVOID TAKING PRODUCTS CONTAINING BIOTIN (commonly found in Hair, Skin, Nails vitamins) AS IT INTERFERES WITH THE VALIDITY OF THYROID FUNCTION BLOOD TESTS.     -Patient is advised to maintain close follow up with Krista Halon, MD for primary care needs.     I spent 20 minutes in the care of the patient today including review of labs from Thyroid Function, CMP, and other relevant labs ; imaging/biopsy records (current and previous including abstractions from other  facilities); face-to-face time discussing  her lab results and symptoms, medications doses, her options of short and long term treatment based on the latest standards  of care / guidelines;   and documenting the encounter.  Krista Ramirez  participated in the discussions, expressed understanding, and voiced agreement with the above plans.  All questions were answered to her satisfaction. she is encouraged to contact clinic should she have any questions or concerns prior to her return visit.  Follow up plan: Return in about 2 months (around 10/31/2021) for Thyroid follow up, Previsit labs.   Thank you for involving me in the care of this pleasant patient, and I will continue to update you with her progress.    Ronny Bacon, [facility] [address] [place], [place] [address] Phone: [phone] Fax: [phone]  08/30/2021, 3:23 PM

## 2021-08-30 NOTE — Patient Instructions (Signed)

## 2021-09-02 ENCOUNTER — Ambulatory Visit (INDEPENDENT_AMBULATORY_CARE_PROVIDER_SITE_OTHER): Payer: Medicare Other

## 2021-09-02 DIAGNOSIS — Z0000 Encounter for general adult medical examination without abnormal findings: Secondary | ICD-10-CM

## 2021-09-02 NOTE — Progress Notes (Signed)
Subjective:   Krista Ramirez is a 66 y.o. female who presents for an Initial Medicare Annual Wellness Visit.  I connected with  Doy Mince on 09/02/21 by a audio enabled telemedicine application and verified that I am speaking with the correct person using two identifiers.  Patient Location: Home  Provider Location: Office/Clinic  I discussed the limitations of evaluation and management by telemedicine. The patient expressed understanding and agreed to proceed.   Review of Systems     Krista Ramirez , Thank you for taking time to come for your Medicare Wellness Visit. I appreciate your ongoing commitment to your health goals. Please review the following plan we discussed and let me know if I can assist you in the future.   These are the goals we discussed:  Goals   None     This is a list of the screening recommended for you and due dates:  Health Maintenance  Topic Date Due   Pneumonia Vaccine (1 - PCV) Never done   Hepatitis C Screening: USPSTF Recommendation to screen - Ages 78-79 yo.  Never done   Tetanus Vaccine  Never done   Mammogram  Never done   Zoster (Shingles) Vaccine (1 of 2) Never done   DEXA scan (bone density measurement)  Never done   COVID-19 Vaccine (2 - Pfizer series) 05/05/2020   Flu Shot  09/14/2021   Cologuard (Stool DNA test)  04/27/2024   HPV Vaccine  Aged Out          Objective:    There were no vitals filed for this visit. There is no height or weight on file to calculate BMI.     03/29/2021   11:33 AM 03/29/2021    7:59 AM  Advanced Directives  Does Patient Have a Medical Advance Directive? No No  Would patient like information on creating a medical advance directive? No - Patient declined No - Patient declined    Current Medications (verified) Outpatient Encounter Medications as of 09/02/2021  Medication Sig   albuterol (VENTOLIN HFA) 108 (90 Base) MCG/ACT inhaler Inhale 2 puffs into the lungs every 6 (six) hours as needed for  wheezing or shortness of breath.   apixaban (ELIQUIS) 5 MG TABS tablet Take 1 tablet (5 mg total) by mouth 2 (two) times daily.   citalopram (CELEXA) 10 MG tablet Take 1 tablet (10 mg total) by mouth daily.   diltiazem (CARDIZEM CD) 120 MG 24 hr capsule Take 1 capsule (120 mg total) by mouth daily.   levothyroxine (SYNTHROID) 25 MCG tablet Take 1 tablet (25 mcg total) by mouth daily before breakfast.   No facility-administered encounter medications on file as of 09/02/2021.    Allergies (verified) Patient has no known allergies.   History: Past Medical History:  Diagnosis Date   COPD (chronic obstructive pulmonary disease) (HCC)    Hyperthyroidism    Past Surgical History:  Procedure Laterality Date   SKIN GRAFT     TUBAL LIGATION     Family History  Problem Relation Age of Onset   Hypertension Mother    Social History   Socioeconomic History   Marital status: Married    Spouse name: Not on file   Number of children: Not on file   Years of education: Not on file   Highest education level: Not on file  Occupational History   Not on file  Tobacco Use   Smoking status: Former    Packs/day: 1.00    Years: 30.00  Total pack years: 30.00    Types: Cigarettes    Quit date: 02/22/2021    Years since quitting: 0.5   Smokeless tobacco: Never  Vaping Use   Vaping Use: Never used  Substance and Sexual Activity   Alcohol use: Not Currently   Drug use: Not Currently   Sexual activity: Not on file  Other Topics Concern   Not on file  Social History Narrative   Not on file   Social Determinants of Health   Financial Resource Strain: Not on file  Food Insecurity: Not on file  Transportation Needs: Not on file  Physical Activity: Not on file  Stress: Not on file  Social Connections: Not on file    Tobacco Counseling Counseling given: Not Answered   Clinical Intake:              How often do you need to have someone help you when you read instructions,  pamphlets, or other written materials from your doctor or pharmacy?: (P) 1 - Never  Diabetic? No         Activities of Daily Living    09/01/2021    5:42 PM 03/29/2021   11:33 AM  In your present state of health, do you have any difficulty performing the following activities:  Hearing? 0 0  Vision? 0 0  Difficulty concentrating or making decisions? 0 0  Walking or climbing stairs? 0 0  Dressing or bathing? 0 0  Doing errands, shopping? 0 0  Preparing Food and eating ? N   Using the Toilet? N   In the past six months, have you accidently leaked urine? N   Do you have problems with loss of bowel control? N   Managing your Medications? N   Managing your Finances? N   Housekeeping or managing your Housekeeping? N     Patient Care Team: Anabel Halon, MD as PCP - General (Internal Medicine)  Indicate any recent Medical Services you may have received from other than [facility] providers in the past year (date may be approximate).     Assessment:   This is a routine wellness examination for Krista Ramirez.  Hearing/Vision screen No results found.  Dietary issues and exercise activities discussed:     Goals Addressed   None   Depression Screen    08/09/2021    2:24 PM 05/26/2021    3:02 PM 04/08/2021    3:44 PM  PHQ 2/9 Scores  PHQ - 2 Score 0 0 0    Fall Risk    09/01/2021    5:42 PM 08/09/2021    2:24 PM 05/26/2021    3:02 PM 04/08/2021    3:44 PM  Fall Risk   Falls in the past year? 0 0 0 0  Number falls in past yr: 0 0 0 0  Injury with Fall? 0 0 0 0  Risk for fall due to :  No Fall Risks No Fall Risks   Follow up  Falls evaluation completed Falls evaluation completed     FALL RISK PREVENTION PERTAINING TO THE HOME:  Any stairs in or around the home? No  If so, are there any without handrails?  NA Home free of loose throw rugs in walkways, pet beds, electrical cords, etc? Yes  Adequate lighting in your home to reduce risk of falls? Yes   ASSISTIVE DEVICES UTILIZED  TO PREVENT FALLS:  Life alert? No  Use of a cane, walker or w/c? No  Grab bars in the  bathroom? No  Shower chair or bench in shower? No  Elevated toilet seat or a handicapped toilet? Yes    Cognitive Function:        Immunizations Immunization History  Administered Date(s) Administered   Influenza-Unspecified 12/03/2020   Pfizer Covid-19 Vaccine Bivalent Booster 49yrs & up 02/17/2019, 03/10/2019, 11/04/2019    TDAP status: Up to date  Flu Vaccine status: Up to date  Pneumococcal vaccine status: Up to date  Covid-19 vaccine status: Completed vaccines  Qualifies for Shingles Vaccine? Yes   Zostavax completed No   Shingrix Completed?: No.    Education has been provided regarding the importance of this vaccine. Patient has been advised to call insurance company to determine out of pocket expense if they have not yet received this vaccine. Advised may also receive vaccine at local pharmacy or Health Dept. Verbalized acceptance and understanding.  Screening Tests Health Maintenance  Topic Date Due   Pneumonia Vaccine 35+ Years old (1 - PCV) Never done   Hepatitis C Screening  Never done   TETANUS/TDAP  Never done   MAMMOGRAM  Never done   Zoster Vaccines- Shingrix (1 of 2) Never done   DEXA SCAN  Never done   COVID-19 Vaccine (2 - Pfizer series) 05/05/2020   INFLUENZA VACCINE  09/14/2021   Fecal DNA (Cologuard)  04/27/2024   HPV VACCINES  Aged Out    Health Maintenance  Health Maintenance Due  Topic Date Due   Pneumonia Vaccine 25+ Years old (1 - PCV) Never done   Hepatitis C Screening  Never done   TETANUS/TDAP  Never done   MAMMOGRAM  Never done   Zoster Vaccines- Shingrix (1 of 2) Never done   DEXA SCAN  Never done   COVID-19 Vaccine (2 - Pfizer series) 05/05/2020    Colorectal cancer screening: Type of screening: Cologuard. Completed 04/27/2021. Repeat every 3 years  Mammogram status: Ordered 08/09/2021. Pt provided with contact info and advised to call to  schedule appt.   Bone Density status: Ordered 08/09/2021. Pt provided with contact info and advised to call to schedule appt.  Lung Cancer Screening: (Low Dose CT Chest recommended if Age 63-80 years, 30 pack-year currently smoking OR have quit w/in 15years.) does qualify.   Lung Cancer Screening Referral: declined  Additional Screening:  Hepatitis C Screening: does qualify; Completed   Vision Screening: Recommended annual ophthalmology exams for early detection of glaucoma and other disorders of the eye. Is the patient up to date with their annual eye exam?  No  Who is the provider or what is the name of the office in which the patient attends annual eye exams? [facility]    Dental Screening: Recommended annual dental exams for proper oral hygiene  Community Resource Referral / Chronic Care Management: CRR required this visit?  No   CCM required this visit?  No      Plan:     I have personally reviewed and noted the following in the patient's chart:   Medical and social history Use of alcohol, tobacco or illicit drugs  Current medications and supplements including opioid prescriptions. Patient is not currently taking opioid prescriptions. Functional ability and status Nutritional status Physical activity Advanced directives List of other physicians Hospitalizations, surgeries, and ER visits in previous 12 months Vitals Screenings to include cognitive, depression, and falls Referrals and appointments  In addition, I have reviewed and discussed with patient certain preventive protocols, quality metrics, and best practice recommendations. A written personalized care plan for preventive  services as well as general preventive health recommendations were provided to patient.     Telford Nab, CMA   09/02/2021   Nurse Notes:  Ms. Barhorst , Thank you for taking time to come for your Medicare Wellness Visit. I appreciate your ongoing commitment to your health goals.  Please review the following plan we discussed and let me know if I can assist you in the future.   These are the goals we discussed:  Goals   None     This is a list of the screening recommended for you and due dates:  Health Maintenance  Topic Date Due   Pneumonia Vaccine (1 - PCV) Never done   Hepatitis C Screening: USPSTF Recommendation to screen - Ages 42-79 yo.  Never done   Tetanus Vaccine  Never done   Mammogram  Never done   Zoster (Shingles) Vaccine (1 of 2) Never done   DEXA scan (bone density measurement)  Never done   COVID-19 Vaccine (2 - Pfizer series) 05/05/2020   Flu Shot  09/14/2021   Cologuard (Stool DNA test)  04/27/2024   HPV Vaccine  Aged Out

## 2021-09-02 NOTE — Patient Instructions (Signed)
Krista Ramirez , Thank you for taking time to come for your Medicare Wellness Visit. I appreciate your ongoing commitment to your health goals. Please review the following plan we discussed and let me know if I can assist you in the future.   Screening recommendations/referrals: Colonoscopy: completed the cologuard Mammogram: It has been ordered Bone Density: it has been ordered Recommended yearly ophthalmology/optometry visit for glaucoma screening and checkup Recommended yearly dental visit for hygiene and checkup  Vaccinations: Influenza vaccine: completed Tdap vaccine: completed  Advanced directives: bring to your next appt if possible  Conditions/risks identified: falls  Next appointment: 1 year   Preventive Care 65 Years and Older, Female Preventive care refers to lifestyle choices and visits with your health care provider that can promote health and wellness. What does preventive care include? A yearly physical exam. This is also called an annual well check. Dental exams once or twice a year. Routine eye exams. Ask your health care provider how often you should have your eyes checked. Personal lifestyle choices, including: Daily care of your teeth and gums. Regular physical activity. Eating a healthy diet. Avoiding tobacco and drug use. Limiting alcohol use. Practicing safe sex. Taking low-dose aspirin every day. Taking vitamin and mineral supplements as recommended by your health care provider. What happens during an annual well check? The services and screenings done by your health care provider during your annual well check will depend on your age, overall health, lifestyle risk factors, and family history of disease. Counseling  Your health care provider may ask you questions about your: Alcohol use. Tobacco use. Drug use. Emotional well-being. Home and relationship well-being. Sexual activity. Eating habits. History of falls. Memory and ability to understand  (cognition). Work and work Astronomer. Reproductive health. Screening  You may have the following tests or measurements: Height, weight, and BMI. Blood pressure. Lipid and cholesterol levels. These may be checked every 5 years, or more frequently if you are over 50 years old. Skin check. Lung cancer screening. You may have this screening every year starting at age 65 if you have a 30-pack-year history of smoking and currently smoke or have quit within the past 15 years. Fecal occult blood test (FOBT) of the stool. You may have this test every year starting at age 27. Flexible sigmoidoscopy or colonoscopy. You may have a sigmoidoscopy every 5 years or a colonoscopy every 10 years starting at age 29. Hepatitis C blood test. Hepatitis B blood test. Sexually transmitted disease (STD) testing. Diabetes screening. This is done by checking your blood sugar (glucose) after you have not eaten for a while (fasting). You may have this done every 1-3 years. Bone density scan. This is done to screen for osteoporosis. You may have this done starting at age 53. Mammogram. This may be done every 1-2 years. Talk to your health care provider about how often you should have regular mammograms. Talk with your health care provider about your test results, treatment options, and if necessary, the need for more tests. Vaccines  Your health care provider may recommend certain vaccines, such as: Influenza vaccine. This is recommended every year. Tetanus, diphtheria, and acellular pertussis (Tdap, Td) vaccine. You may need a Td booster every 10 years. Zoster vaccine. You may need this after age 47. Pneumococcal 13-valent conjugate (PCV13) vaccine. One dose is recommended after age 54. Pneumococcal polysaccharide (PPSV23) vaccine. One dose is recommended after age 59. Talk to your health care provider about which screenings and vaccines you need and how often  you need them. This information is not intended to  replace advice given to you by your health care provider. Make sure you discuss any questions you have with your health care provider. Document Released: 02/27/2015 Document Revised: 10/21/2015 Document Reviewed: 12/02/2014 Elsevier Interactive Patient Education  2017 [employer] Prevention in the Home Falls can cause injuries. They can happen to people of all ages. There are many things you can do to make your home safe and to help prevent falls. What can I do on the outside of my home? Regularly fix the edges of walkways and driveways and fix any cracks. Remove anything that might make you trip as you walk through a door, such as a raised step or threshold. Trim any bushes or trees on the path to your home. Use bright outdoor lighting. Clear any walking paths of anything that might make someone trip, such as rocks or tools. Regularly check to see if handrails are loose or broken. Make sure that both sides of any steps have handrails. Any raised decks and porches should have guardrails on the edges. Have any leaves, snow, or ice cleared regularly. Use sand or salt on walking paths during winter. Clean up any spills in your garage right away. This includes oil or grease spills. What can I do in the bathroom? Use night lights. Install grab bars by the toilet and in the tub and shower. Do not use towel bars as grab bars. Use non-skid mats or decals in the tub or shower. If you need to sit down in the shower, use a plastic, non-slip stool. Keep the floor dry. Clean up any water that spills on the floor as soon as it happens. Remove soap buildup in the tub or shower regularly. Attach bath mats securely with double-sided non-slip rug tape. Do not have throw rugs and other things on the floor that can make you trip. What can I do in the bedroom? Use night lights. Make sure that you have a light by your bed that is easy to reach. Do not use any sheets or blankets that are too big for  your bed. They should not hang down onto the floor. Have a firm chair that has side arms. You can use this for support while you get dressed. Do not have throw rugs and other things on the floor that can make you trip. What can I do in the kitchen? Clean up any spills right away. Avoid walking on wet floors. Keep items that you use a lot in easy-to-reach places. If you need to reach something above you, use a strong step stool that has a grab bar. Keep electrical cords out of the way. Do not use floor polish or wax that makes floors slippery. If you must use wax, use non-skid floor wax. Do not have throw rugs and other things on the floor that can make you trip. What can I do with my stairs? Do not leave any items on the stairs. Make sure that there are handrails on both sides of the stairs and use them. Fix handrails that are broken or loose. Make sure that handrails are as long as the stairways. Check any carpeting to make sure that it is firmly attached to the stairs. Fix any carpet that is loose or worn. Avoid having throw rugs at the top or bottom of the stairs. If you do have throw rugs, attach them to the floor with carpet tape. Make sure that you have a light  switch at the top of the stairs and the bottom of the stairs. If you do not have them, ask someone to add them for you. What else can I do to help prevent falls? Wear shoes that: Do not have high heels. Have rubber bottoms. Are comfortable and fit you well. Are closed at the toe. Do not wear sandals. If you use a stepladder: Make sure that it is fully opened. Do not climb a closed stepladder. Make sure that both sides of the stepladder are locked into place. Ask someone to hold it for you, if possible. Clearly mark and make sure that you can see: Any grab bars or handrails. First and last steps. Where the edge of each step is. Use tools that help you move around (mobility aids) if they are needed. These  include: Canes. Walkers. Scooters. Crutches. Turn on the lights when you go into a dark area. Replace any light bulbs as soon as they burn out. Set up your furniture so you have a clear path. Avoid moving your furniture around. If any of your floors are uneven, fix them. If there are any pets around you, be aware of where they are. Review your medicines with your doctor. Some medicines can make you feel dizzy. This can increase your chance of falling. Ask your doctor what other things that you can do to help prevent falls. This information is not intended to replace advice given to you by your health care provider. Make sure you discuss any questions you have with your health care provider. Document Released: 11/27/2008 Document Revised: 07/09/2015 Document Reviewed: 03/07/2014 Elsevier Interactive Patient Education  2017 [employer].

## 2021-10-28 LAB — T4, FREE: Free T4: 2.12 ng/dL — ABNORMAL HIGH (ref 0.82–1.77)

## 2021-10-28 LAB — TSH: TSH: <0.005 u[IU]/mL — ABNORMAL LOW (ref 0.450–4.500)

## 2021-11-01 NOTE — Patient Instructions (Signed)

## 2021-11-02 ENCOUNTER — Encounter: Payer: Self-pay | Admitting: Nurse Practitioner

## 2021-11-02 ENCOUNTER — Ambulatory Visit (INDEPENDENT_AMBULATORY_CARE_PROVIDER_SITE_OTHER): Payer: Medicare Other | Admitting: Nurse Practitioner

## 2021-11-02 VITALS — BP 128/79 | HR 69 | Ht 62.5 in | Wt 160.0 lb

## 2021-11-02 DIAGNOSIS — E890 Postprocedural hypothyroidism: Secondary | ICD-10-CM | POA: Diagnosis not present

## 2021-11-02 MED ORDER — LEVOTHYROXINE SODIUM 25 MCG PO TABS: 25.0000 ug | ORAL_TABLET | Freq: Every day | ORAL | 0 refills | Status: DC

## 2021-11-02 NOTE — Progress Notes (Signed)
11/02/2021     Endocrinology Follow Up Note     Subjective:    Patient ID: Krista Ramirez, female    DOB: [birth year], PCP Lindell Spar, MD.   Past Medical History:  Diagnosis Date   Anxiety 03/17/2021   COPD (chronic obstructive pulmonary disease) ([place])    Hyperthyroidism     Past Surgical History:  Procedure Laterality Date   SKIN GRAFT     TUBAL LIGATION      Social History   Socioeconomic History   Marital status: Married    Spouse name: Not on file   Number of children: Not on file   Years of education: Not on file   Highest education level: Not on file  Occupational History   Not on file  Tobacco Use   Smoking status: Former    Packs/day: 1.00    Years: 30.00    Total pack years: 30.00    Types: Cigarettes    Quit date: 02/22/2021    Years since quitting: 0.6   Smokeless tobacco: Never  Vaping Use   Vaping Use: Never used  Substance and Sexual Activity   Alcohol use: Never   Drug use: Never   Sexual activity: Yes    Birth control/protection: None  Other Topics Concern   Not on file  Social History Narrative   Not on file   Social Determinants of Health   Financial Resource Strain: Low Risk  (09/02/2021)   Overall Financial Resource Strain (CARDIA)    Difficulty of Paying Living Expenses: Not hard at all  Food Insecurity: No Food Insecurity (09/02/2021)   Hunger Vital Sign    Worried About Running Out of Food in the Last Year: Never true    Ran Out of Food in the Last Year: Never true  Transportation Needs: No Transportation Needs (09/02/2021)   PRAPARE - Hydrologist (Medical): No    Lack of Transportation (Non-Medical): No  Physical Activity: Sufficiently Active (09/02/2021)   Exercise Vital Sign    Days of Exercise per Week: 5 days    Minutes of Exercise per Session: 150+ min  Stress: No Stress Concern Present (09/02/2021)   [place]     Feeling of Stress : Not at all  Social Connections: Moderately Isolated (09/02/2021)   Social Connection and Isolation Panel [NHANES]    Frequency of Communication with Friends and Family: Three times a week    Frequency of Social Gatherings with Friends and Family: Once a week    Attends Religious Services: Never    Marine scientist or Organizations: No    Attends Archivist Meetings: Never    Marital Status: Married    Family History  Problem Relation Age of Onset   Hypertension Mother     Outpatient Encounter Medications as of 11/02/2021  Medication Sig   albuterol (VENTOLIN HFA) 108 (90 Base) MCG/ACT inhaler Inhale 2 puffs into the lungs every 6 (six) hours as needed for wheezing or shortness of breath.   apixaban (ELIQUIS) 5 MG TABS tablet Take 1 tablet (5 mg total) by mouth 2 (two) times daily.   citalopram (CELEXA) 10 MG tablet Take 1 tablet (10 mg total) by mouth daily.   diltiazem (CARDIZEM CD) 120 MG 24 hr capsule Take 1 capsule (120 mg total) by mouth daily.   [DISCONTINUED] levothyroxine (SYNTHROID) 25 MCG tablet Take 1 tablet (25 mcg total) by mouth  daily before breakfast.   levothyroxine (SYNTHROID) 25 MCG tablet Take 1 tablet (25 mcg total) by mouth daily before breakfast.   No facility-administered encounter medications on file as of 11/02/2021.    ALLERGIES: No Known Allergies  VACCINATION STATUS: Immunization History  Administered Date(s) Administered   Influenza-Unspecified 12/03/2020   Pfizer Covid-19 Vaccine Bivalent Booster 39yrs & up 02/17/2019, 03/10/2019, 11/04/2019     HPI  Krista Ramirez is 66 y.o. female who presents today with a medical history as above. she is being seen in follow up after being seen in consultation for hyperthyroidism requested by Lindell Spar, MD.  she has been dealing with symptoms of anxiety, insomnia, irritability, palpitations, weight loss, and tremors for 6-8 months. These symptoms are progressively  worsening and troubling to her.  her most recent thyroid labs revealed suppressed TSH of < 0.010, high Free T3 of 26.5 and high Free T4 of 4.79 on 03/29/21. she denies dysphagia, choking, shortness of breath, no recent voice change.    she does have family history of thyroid dysfunction in her siblings, but denies family hx of thyroid cancer. she denies personal history of goiter. she is currently on Methimazole 10 mg po twice daily. Denies use of Biotin containing supplements.  she is willing to proceed with appropriate work up and therapy for thyrotoxicosis.   Review of systems  Constitutional: + Minimally fluctuating body weight,  current Body mass index is 28.8 kg/m. , no fatigue, no subjective hyperthermia, no subjective hypothermia Eyes: no blurry vision, no xerophthalmia ENT: no sore throat, no nodules palpated in throat, no dysphagia/odynophagia, no hoarseness Cardiovascular: no chest pain, no shortness of breath, no palpitations, no leg swelling Respiratory: no cough, no shortness of breath Gastrointestinal: no nausea/vomiting/diarrhea Musculoskeletal: no muscle/joint aches Skin: no rashes, no hyperemia Neurological: no tremors, no numbness, no tingling, no dizziness Psychiatric: no depression, no anxiety   Objective:    BP 128/79 (BP Location: Left Arm, Patient Position: Sitting, Cuff Size: Large)   Pulse 69   Ht 5' 2.5" (1.588 m)   Wt 160 lb (72.6 kg)   BMI 28.80 kg/m   Wt Readings from Last 3 Encounters:  11/02/21 160 lb (72.6 kg)  08/30/21 155 lb (70.3 kg)  08/09/21 156 lb 12.8 oz (71.1 kg)     BP Readings from Last 3 Encounters:  11/02/21 128/79  08/30/21 130/75  08/09/21 132/80     Physical Exam- Limited  Constitutional:  Body mass index is 28.8 kg/m. , not in acute distress, anxious state of mind Eyes:  EOMI, no exophthalmos Neck: Supple Cardiovascular: RRR, no murmurs, rubs, or gallops, no edema Respiratory: Adequate breathing efforts, no crackles,  rales, rhonchi, or wheezing Musculoskeletal: no gross deformities, strength intact in all four extremities, no gross restriction of joint movements Skin:  no rashes, no hyperemia Neurological: no tremor with outstretched hands   CMP     Component Value Date/Time   NA 140 03/30/2021 0438   K 4.4 03/30/2021 0438   CL 110 03/30/2021 0438   CO2 21 (L) 03/30/2021 0438   GLUCOSE 105 (H) 03/30/2021 0438   BUN 28 (H) 03/30/2021 0438   CREATININE 0.56 03/30/2021 0438   CALCIUM 9.3 03/30/2021 0438   PROT 6.3 (L) 03/29/2021 0815   ALBUMIN 3.4 (L) 03/29/2021 0815   AST 27 03/29/2021 0815   ALT 23 03/29/2021 0815   ALKPHOS 171 (H) 03/29/2021 0815   BILITOT 0.7 03/29/2021 0815   GFRNONAA >60 03/30/2021 0076  CBC    Component Value Date/Time   WBC 6.4 04/28/2021 0806   RBC 3.79 (L) 04/28/2021 0806   HGB 10.1 (L) 04/28/2021 0806   HCT 33.4 (L) 04/28/2021 0806   PLT 250 04/28/2021 0806   MCV 88.1 04/28/2021 0806   MCH 26.6 04/28/2021 0806   MCHC 30.2 04/28/2021 0806   RDW 14.4 04/28/2021 0806     Diabetic Labs (most recent): No results found for: "HGBA1C", "MICROALBUR"  Lipid Panel  No results found for: "CHOL", "TRIG", "HDL", "CHOLHDL", "VLDL", "LDLCALC", "LDLDIRECT", "LABVLDL"   Lab Results  Component Value Date   TSH <0.005 (L) 10/27/2021   TSH <0.005 (L) 08/25/2021   TSH 0.108 (L) 06/28/2021   TSH <0.005 (L) 04/20/2021   TSH <0.010 (L) 03/29/2021   FREET4 2.12 (H) 10/27/2021   FREET4 1.66 08/25/2021   FREET4 0.38 (L) 06/28/2021   FREET4 0.75 (L) 04/20/2021   FREET4 4.79 (H) 03/29/2021       Latest Reference Range & Units 03/29/21 08:16 04/20/21 13:49  TSH 0.450 - 4.500 uIU/mL <0.010 (L) <0.005 (L)  Triiodothyronine,Free,Serum 2.0 - 4.4 pg/mL 26.5 (H) 4.2  T4,Free(Direct) 0.82 - 1.77 ng/dL 4.79 (H) 0.75 (L)  Thyroperoxidase Ab SerPl-aCnc 0 - 34 IU/mL  210 (H)  Thyroglobulin Antibody 0.0 - 0.9 IU/mL  1,505.7 (H)  (L): Data is abnormally low (H): Data is  abnormally high   Uptake and Scan from 04/29/21 CLINICAL DATA:  Hyperthyroidism. TSH equal 0.005. Nervousness. And increased appetite. Difficulty sleeping. Hair loss. Heart palpitations.   EXAM: THYROID SCAN AND UPTAKE - 4 AND 24 HOURS   TECHNIQUE: Following oral administration of I-123 capsule, anterior planar imaging was acquired at 24 hours. Thyroid uptake was calculated with a thyroid probe at 4-6 hours and 24 hours.   RADIOPHARMACEUTICALS:  Three hundred uCi I-123 sodium iodide p.o.   COMPARISON:  None.   FINDINGS: Uniform uptake within thyroid gland.  No nodularity.   4 hour I-123 uptake = 71.8% (normal 5-20%)   24 hour I-123 uptake = 59.4% (normal 10-30%)   IMPRESSION: 1. Thyroid imaging and I 123 uptake consistent with Graves disease. 2. Rapid turnover of I 123 at 4 hour uptake.     Electronically Signed   By: Suzy Bouchard M.D.   On: 04/29/2021 16:05   Latest Reference Range & Units 03/29/21 08:16 04/20/21 13:49 06/28/21 13:27 08/25/21 08:12 10/27/21 08:36  TSH 0.450 - 4.500 uIU/mL <0.010 (L) <0.005 (L) 0.108 (L) <0.005 (L) <0.005 (L)  Triiodothyronine,Free,Serum 2.0 - 4.4 pg/mL 26.5 (H) 4.2 1.7 (L)    T4,Free(Direct) 0.82 - 1.77 ng/dL 4.79 (H) 0.75 (L) 0.38 (L) 1.66 2.12 (H)  Thyroperoxidase Ab SerPl-aCnc 0 - 34 IU/mL  210 (H)     Thyroglobulin Antibody 0.0 - 0.9 IU/mL  1,505.7 (H)     (L): Data is abnormally low (H): Data is abnormally high Assessment & Plan:   1) Hypothyroidism- S/p RAI ablation for Graves Disease  she is being seen at a kind request of Lindell Spar, MD.  She is S/P RAI ablation on 05/07/21   Her previsit thyroid function tests are consistent with slight over-replacement.  She is advised to continue her Levothyroxine 25 mcg po daily before breakfast but to skip 1 day out of the week to help her levels.  Will recheck TFTs in 2 months to assess need for dosage adjustment.  She asks about whether or not she needs to stay on the  Cardizem. I advised her to continue  for now until we get her thyroid to goal.   - The correct intake of thyroid hormone (Levothyroxine, Synthroid), is on empty stomach first thing in the morning, with water, separated by at least 30 minutes from breakfast and other medications,  and separated by more than 4 hours from calcium, iron, multivitamins, acid reflux medications (PPIs).  - This medication is a life-long medication and will be needed to correct thyroid hormone imbalances for the rest of your life.  The dose may change from time to time, based on thyroid blood work.  - It is extremely important to be consistent taking this medication, near the same time each morning.  -AVOID TAKING PRODUCTS CONTAINING BIOTIN (commonly found in Hair, Skin, Nails vitamins) AS IT INTERFERES WITH THE VALIDITY OF THYROID FUNCTION BLOOD TESTS.     -Patient is advised to maintain close follow up with Lindell Spar, MD for primary care needs.     I spent 20 minutes in the care of the patient today including review of labs from Thyroid Function, CMP, and other relevant labs ; imaging/biopsy records (current and previous including abstractions from other facilities); face-to-face time discussing  her lab results and symptoms, medications doses, her options of short and long term treatment based on the latest standards of care / guidelines;   and documenting the encounter.  Krista Ramirez  participated in the discussions, expressed understanding, and voiced agreement with the above plans.  All questions were answered to her satisfaction. she is encouraged to contact clinic should she have any questions or concerns prior to her return visit.  Follow up plan: Return in about 2 months (around 01/02/2022) for Thyroid follow up, Previsit labs.   Thank you for involving me in the care of this pleasant patient, and I will continue to update you with her progress.   Rayetta Pigg, [facility] [address] [place], [place] [address] Phone: [phone] Fax: [phone]  11/02/2021, 3:28 PM

## 2021-11-18 ENCOUNTER — Other Ambulatory Visit: Payer: Self-pay | Admitting: *Deleted

## 2021-11-18 DIAGNOSIS — I4891 Unspecified atrial fibrillation: Secondary | ICD-10-CM

## 2021-11-18 MED ORDER — DILTIAZEM HCL ER COATED BEADS 120 MG PO CP24: 120.0000 mg | ORAL_CAPSULE | Freq: Every day | ORAL | 5 refills | Status: DC

## 2021-11-29 ENCOUNTER — Other Ambulatory Visit: Payer: Self-pay | Admitting: *Deleted

## 2021-11-29 DIAGNOSIS — I4891 Unspecified atrial fibrillation: Secondary | ICD-10-CM

## 2021-11-29 MED ORDER — APIXABAN 5 MG PO TABS: 5.0000 mg | ORAL_TABLET | Freq: Two times a day (BID) | ORAL | 5 refills | Status: DC

## 2021-12-09 ENCOUNTER — Encounter: Payer: Self-pay | Admitting: Internal Medicine

## 2021-12-09 ENCOUNTER — Ambulatory Visit (INDEPENDENT_AMBULATORY_CARE_PROVIDER_SITE_OTHER): Payer: Medicare Other | Admitting: Internal Medicine

## 2021-12-09 VITALS — BP 136/86 | HR 79 | Resp 18 | Ht 62.5 in | Wt 156.2 lb

## 2021-12-09 DIAGNOSIS — Z7901 Long term (current) use of anticoagulants: Secondary | ICD-10-CM

## 2021-12-09 DIAGNOSIS — I480 Paroxysmal atrial fibrillation: Secondary | ICD-10-CM

## 2021-12-09 DIAGNOSIS — J439 Emphysema, unspecified: Secondary | ICD-10-CM | POA: Diagnosis not present

## 2021-12-09 DIAGNOSIS — F411 Generalized anxiety disorder: Secondary | ICD-10-CM

## 2021-12-09 DIAGNOSIS — J0110 Acute frontal sinusitis, unspecified: Secondary | ICD-10-CM

## 2021-12-09 DIAGNOSIS — E890 Postprocedural hypothyroidism: Secondary | ICD-10-CM | POA: Diagnosis not present

## 2021-12-09 DIAGNOSIS — Z1159 Encounter for screening for other viral diseases: Secondary | ICD-10-CM

## 2021-12-09 MED ORDER — BENZONATATE 100 MG PO CAPS: 100.0000 mg | ORAL_CAPSULE | Freq: Two times a day (BID) | ORAL | 0 refills | Status: DC | PRN

## 2021-12-09 MED ORDER — AZITHROMYCIN 250 MG PO TABS: ORAL_TABLET | ORAL | 0 refills | Status: AC

## 2021-12-09 NOTE — Assessment & Plan Note (Signed)
Had A-fib with RVR during recent hospital visit, likely due to uncontrolled hyperthyroidism Currently in sinus rhythm Currently on Diltiazem and Eliquis -advised to continue for now, refills provided Followed by cardiology 

## 2021-12-09 NOTE — Progress Notes (Signed)
Established Patient Office Visit  Subjective:  Patient ID: Krista Ramirez, female    DOB: [birth year]  Age: 66 y.o. MRN: [id number]  CC:  Chief Complaint  Patient presents with   Follow-up    4 month follow up pt has cough congestion fever since 12-02-21 had covid test at work was neg    HPI Krista Ramirez is a 66 y.o. female with past medical history of hyperthyroidism, atrial fibrillation and GAD who presents for f/u of her chronic medical conditions.  She complains of cough, fever and nasal congestion for the last 1 week.  She denies any dyspnea or wheezing currently.  Her COVID test was negative at her workplace.  She has tried OTC antitussive without much relief.  She has had radioactive ablation of thyroid for hyperthyroidism.  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  Her thyroid function test showed oversupplementation recently, and she was told to skip a day in a week on her levothyroxine. She has noticed weight gain recently and is concerned about it.  She denies chronic fatigue, tremors or palpitations currently.  She was started on Cardizem and Eliquis for episode of A-fib with RVR during her hospital stay, which was likely provoked by uncontrolled hyperthyroidism.  She has seen cardiologist since then.  She currently denies any chest pain, dyspnea or palpitations.  Her BP has been well controlled at home.  Past Medical History:  Diagnosis Date   Anxiety 03/17/2021   COPD (chronic obstructive pulmonary disease) ([facility])    Hyperthyroidism     Past Surgical History:  Procedure Laterality Date   SKIN GRAFT     TUBAL LIGATION      Family History  Problem Relation Age of Onset   Hypertension Mother     Social History   Socioeconomic History   Marital status: Married    Spouse name: Not on file   Number of children: Not on file   Years of education: Not on file   Highest education level: Not on file  Occupational History   Not on file   Tobacco Use   Smoking status: Former    Packs/day: 1.00    Years: 30.00    Total pack years: 30.00    Types: Cigarettes    Quit date: 02/22/2021    Years since quitting: 0.7   Smokeless tobacco: Never  Vaping Use   Vaping Use: Never used  Substance and Sexual Activity   Alcohol use: Never   Drug use: Never   Sexual activity: Yes    Birth control/protection: None  Other Topics Concern   Not on file  Social History Narrative   Not on file   Social Determinants of Health   Financial Resource Strain: Low Risk  (09/02/2021)   Overall Financial Resource Strain (CARDIA)    Difficulty of Paying Living Expenses: Not hard at all  Food Insecurity: No Food Insecurity (09/02/2021)   Hunger Vital Sign    Worried About Running Out of Food in the Last Year: Never true    [place] in the Last Year: Never true  Transportation Needs: No Transportation Needs (09/02/2021)   PRAPARE - Hydrologist (Medical): No    Lack of Transportation (Non-Medical): No  Physical Activity: Sufficiently Active (09/02/2021)   Exercise Vital Sign    Days of Exercise per Week: 5 days    Minutes of Exercise per Session: 150+ min  Stress: No Stress Concern Present (09/02/2021)  [employer] - Occupational Stress Questionnaire    Feeling of Stress : Not at all  Social Connections: Moderately Isolated (09/02/2021)   Social Connection and Isolation Panel [NHANES]    Frequency of Communication with Friends and Family: Three times a week    Frequency of Social Gatherings with Friends and Family: Once a week    Attends Religious Services: Never    Marine scientist or Organizations: No    Attends Archivist Meetings: Never    Marital Status: Married  Human resources officer Violence: Not At Risk (09/02/2021)   Humiliation, Afraid, Rape, and Kick questionnaire    Fear of Current or Ex-Partner: No    Emotionally Abused: No    Physically Abused: No     Sexually Abused: No    Outpatient Medications Prior to Visit  Medication Sig Dispense Refill   albuterol (VENTOLIN HFA) 108 (90 Base) MCG/ACT inhaler Inhale 2 puffs into the lungs every 6 (six) hours as needed for wheezing or shortness of breath. 8 g 5   apixaban (ELIQUIS) 5 MG TABS tablet Take 1 tablet (5 mg total) by mouth 2 (two) times daily. 60 tablet 5   citalopram (CELEXA) 10 MG tablet Take 1 tablet (10 mg total) by mouth daily. 30 tablet 5   diltiazem (CARDIZEM CD) 120 MG 24 hr capsule Take 1 capsule (120 mg total) by mouth daily. 30 capsule 5   levothyroxine (SYNTHROID) 25 MCG tablet Take 1 tablet (25 mcg total) by mouth daily before breakfast. 90 tablet 0   No facility-administered medications prior to visit.    No Known Allergies  ROS Review of Systems  Constitutional:  Negative for chills and fever.  HENT:  Positive for congestion, postnasal drip, sinus pressure and sinus pain.   Eyes:  Negative for pain and discharge.  Respiratory:  Positive for cough. Negative for shortness of breath.   Cardiovascular:  Negative for chest pain and palpitations.  Gastrointestinal:  Negative for abdominal pain, diarrhea, nausea and vomiting.  Endocrine: Negative for polydipsia and polyuria.  Genitourinary:  Negative for dysuria and hematuria.  Musculoskeletal:  Negative for neck pain and neck stiffness.  Skin:  Negative for rash.  Neurological:  Negative for dizziness and weakness.  Psychiatric/Behavioral:  Negative for agitation and behavioral problems. The patient is nervous/anxious.       Objective:    Physical Exam Vitals reviewed.  Constitutional:      General: She is not in acute distress.    Appearance: She is not diaphoretic.  HENT:     Head: Normocephalic and atraumatic.     Nose: Congestion present.     Right Sinus: Frontal sinus tenderness present.     Left Sinus: Frontal sinus tenderness present.     Mouth/Throat:     Mouth: Mucous membranes are moist.  Eyes:      General: No scleral icterus.    Extraocular Movements: Extraocular movements intact.  Cardiovascular:     Rate and Rhythm: Normal rate and regular rhythm.     Pulses: Normal pulses.     Heart sounds: Normal heart sounds. No murmur heard. Pulmonary:     Breath sounds: Normal breath sounds. No wheezing or rales.  Musculoskeletal:     Cervical back: Neck supple. No tenderness.     Right lower leg: No edema.     Left lower leg: No edema.  Skin:    General: Skin is warm.     Findings: No rash.  Neurological:  General: No focal deficit present.     Mental Status: She is alert and oriented to person, place, and time.     Sensory: No sensory deficit.     Motor: No weakness.  Psychiatric:        Mood and Affect: Mood normal.        Behavior: Behavior normal.     BP 136/86 (BP Location: Right Arm, Patient Position: Sitting, Cuff Size: Normal)   Pulse 79   Resp 18   Ht 5' 2.5" (1.588 m)   Wt 156 lb 3.2 oz (70.9 kg)   SpO2 98%   BMI 28.11 kg/m  Wt Readings from Last 3 Encounters:  12/09/21 156 lb 3.2 oz (70.9 kg)  11/02/21 160 lb (72.6 kg)  08/30/21 155 lb (70.3 kg)    Lab Results  Component Value Date   TSH <0.005 (L) 10/27/2021   Lab Results  Component Value Date   WBC 6.4 04/28/2021   HGB 10.1 (L) 04/28/2021   HCT 33.4 (L) 04/28/2021   MCV 88.1 04/28/2021   PLT 250 04/28/2021   Lab Results  Component Value Date   NA 140 03/30/2021   K 4.4 03/30/2021   CO2 21 (L) 03/30/2021   GLUCOSE 105 (H) 03/30/2021   BUN 28 (H) 03/30/2021   CREATININE 0.56 03/30/2021   BILITOT 0.7 03/29/2021   ALKPHOS 171 (H) 03/29/2021   AST 27 03/29/2021   ALT 23 03/29/2021   PROT 6.3 (L) 03/29/2021   ALBUMIN 3.4 (L) 03/29/2021   CALCIUM 9.3 03/30/2021   ANIONGAP 9 03/30/2021   No results found for: "CHOL" No results found for: "HDL" No results found for: "LDLCALC" No results found for: "TRIG" No results found for: "CHOLHDL" No results found for: "HGBA1C"    Assessment &  Plan:   Problem List Items Addressed This Visit       Cardiovascular and Mediastinum   Atrial fibrillation ([place])    Had A-fib with RVR during recent hospital visit, likely due to uncontrolled hyperthyroidism Currently in sinus rhythm Currently on Diltiazem and Eliquis -advised to continue for now, refills provided Followed by cardiology        Respiratory   COPD (chronic obstructive pulmonary disease) ([place])    Has quit smoking now Has history of 30-pack-year smoking Uses albuterol as needed for dyspnea or wheezing      Relevant Medications   azithromycin (ZITHROMAX) 250 MG tablet   benzonatate (TESSALON) 100 MG capsule     Endocrine   Postablative hypothyroidism - Primary    Lab Results  Component Value Date   TSH <0.005 (L) 10/27/2021  On Levothyroxine 25 mcg QD Followed by Endocrinology Her weight gain likely due to her being in hypothyroid state      Relevant Orders   CMP14+EGFR     Other   GAD (generalized anxiety disorder)    Well-controlled with Celexa      Other Visit Diagnoses     Need for hepatitis C screening test       Relevant Orders   Hepatitis C Antibody   Chronic anticoagulation       Relevant Orders   CBC with Differential/Platelet   Acute non-recurrent frontal sinusitis     Started empiric azithromycin as she has persistent symptoms despite symptomatic treatment Tessalon PRN for cough Nasal saline spray as needed for nasal congestion   Relevant Medications   azithromycin (ZITHROMAX) 250 MG tablet   benzonatate (TESSALON) 100 MG capsule  Meds ordered this encounter  Medications   azithromycin (ZITHROMAX) 250 MG tablet    Sig: Take 2 tablets on day 1, then 1 tablet daily on days 2 through 5    Dispense:  6 tablet    Refill:  0   benzonatate (TESSALON) 100 MG capsule    Sig: Take 1 capsule (100 mg total) by mouth 2 (two) times daily as needed for cough.    Dispense:  20 capsule    Refill:  0    Follow-up: Return in about 4  months (around 04/11/2022).    Lindell Spar, MD

## 2021-12-09 NOTE — Assessment & Plan Note (Signed)
Well-controlled with Celexa 

## 2021-12-09 NOTE — Assessment & Plan Note (Signed)
Lab Results  Component Value Date   TSH <0.005 (L) 10/27/2021   On Levothyroxine 25 mcg QD Followed by Endocrinology Her weight gain likely due to her being in hypothyroid state

## 2021-12-09 NOTE — Patient Instructions (Signed)
Please take Azithromycin for sinusitis. Please take Tessalon as needed for cough.  Please continue taking other medications as prescribed.  Please continue to follow low salt diet and perform moderate exercise/walking at least 150 mins/week.

## 2021-12-09 NOTE — Assessment & Plan Note (Signed)
Has quit smoking now Has history of 30-pack-year smoking Uses albuterol as needed for dyspnea or wheezing

## 2021-12-11 LAB — CMP14+EGFR
ALT: 20 IU/L (ref 0–32)
AST: 17 IU/L (ref 0–40)
Albumin/Globulin Ratio: 1.5 (ref 1.2–2.2)
Albumin: 4.2 g/dL (ref 3.9–4.9)
Alkaline Phosphatase: 233 IU/L — ABNORMAL HIGH (ref 44–121)
BUN/Creatinine Ratio: 15 (ref 12–28)
BUN: 12 mg/dL (ref 8–27)
Bilirubin Total: 0.2 mg/dL (ref 0.0–1.2)
CO2: 22 mmol/L (ref 20–29)
Calcium: 9.5 mg/dL (ref 8.7–10.3)
Chloride: 107 mmol/L — ABNORMAL HIGH (ref 96–106)
Creatinine, Ser: 0.8 mg/dL (ref 0.57–1.00)
Globulin, Total: 2.8 g/dL (ref 1.5–4.5)
Glucose: 100 mg/dL — ABNORMAL HIGH (ref 70–99)
Potassium: 4.7 mmol/L (ref 3.5–5.2)
Sodium: 143 mmol/L (ref 134–144)
Total Protein: 7 g/dL (ref 6.0–8.5)
eGFR: 81 mL/min/{1.73_m2} (ref >59)

## 2021-12-11 LAB — CBC WITH DIFFERENTIAL/PLATELET
Basophils Absolute: 0 10*3/uL (ref 0.0–0.2)
Basos: 0 %
EOS (ABSOLUTE): 0 10*3/uL (ref 0.0–0.4)
Eos: 1 %
Hematocrit: 34.2 % (ref 34.0–46.6)
Hemoglobin: 10.7 g/dL — ABNORMAL LOW (ref 11.1–15.9)
Immature Grans (Abs): 0 10*3/uL (ref 0.0–0.1)
Immature Granulocytes: 0 %
Lymphocytes Absolute: 1.7 10*3/uL (ref 0.7–3.1)
Lymphs: 34 %
MCH: 26.8 pg (ref 26.6–33.0)
MCHC: 31.3 g/dL — ABNORMAL LOW (ref 31.5–35.7)
MCV: 86 fL (ref 79–97)
Monocytes Absolute: 0.4 10*3/uL (ref 0.1–0.9)
Monocytes: 7 %
Neutrophils Absolute: 2.8 10*3/uL (ref 1.4–7.0)
Neutrophils: 58 %
Platelets: 334 10*3/uL (ref 150–450)
RBC: 3.99 x10E6/uL (ref 3.77–5.28)
RDW: 13.7 % (ref 11.7–15.4)
WBC: 4.9 10*3/uL (ref 3.4–10.8)

## 2021-12-11 LAB — HEPATITIS C ANTIBODY: Hep C Virus Ab: NONREACTIVE

## 2021-12-22 ENCOUNTER — Telehealth: Payer: Self-pay | Admitting: Internal Medicine

## 2021-12-22 ENCOUNTER — Other Ambulatory Visit: Payer: Self-pay | Admitting: Internal Medicine

## 2021-12-22 DIAGNOSIS — J0110 Acute frontal sinusitis, unspecified: Secondary | ICD-10-CM

## 2021-12-22 MED ORDER — BENZONATATE 100 MG PO CAPS: 100.0000 mg | ORAL_CAPSULE | Freq: Two times a day (BID) | ORAL | 1 refills | Status: DC | PRN

## 2021-12-22 NOTE — Telephone Encounter (Signed)
  Prescription Request  12/22/2021  Is this a "Controlled Substance" medicine? No  LOV: 12/09/2021   What is the name of the medication or equipment? benzonatate (TESSALON) 100 MG capsule   Have you contacted your pharmacy to request a refill? No   Which pharmacy would you like this sent to?  [facility] [place], [place] - [address] [place] [address] Phone: [phone] Fax: [phone]   Patient notified that their request is being sent to the clinical staff for review and that they should receive a response within 2 business days.   Please advise at [phone]

## 2021-12-31 LAB — T4, FREE: Free T4: 2.1 ng/dL — ABNORMAL HIGH (ref 0.82–1.77)

## 2021-12-31 LAB — TSH: TSH: <0.005 u[IU]/mL — ABNORMAL LOW (ref 0.450–4.500)

## 2022-01-03 ENCOUNTER — Encounter: Payer: Self-pay | Admitting: Nurse Practitioner

## 2022-01-03 ENCOUNTER — Ambulatory Visit (INDEPENDENT_AMBULATORY_CARE_PROVIDER_SITE_OTHER): Payer: Medicare Other | Admitting: Nurse Practitioner

## 2022-01-03 VITALS — BP 110/68 | HR 70 | Ht 62.4 in | Wt 157.6 lb

## 2022-01-03 DIAGNOSIS — E890 Postprocedural hypothyroidism: Secondary | ICD-10-CM

## 2022-01-03 NOTE — Progress Notes (Signed)
01/03/2022     Endocrinology Follow Up Note     Subjective:    Patient ID: Krista Ramirez, female    DOB: [birth year], PCP Anabel Halon, MD.   Past Medical History:  Diagnosis Date   Anxiety 03/17/2021   COPD (chronic obstructive pulmonary disease) (HCC)    Hyperthyroidism     Past Surgical History:  Procedure Laterality Date   SKIN GRAFT     TUBAL LIGATION      Social History   Socioeconomic History   Marital status: Married    Spouse name: Not on file   Number of children: Not on file   Years of education: Not on file   Highest education level: Not on file  Occupational History   Not on file  Tobacco Use   Smoking status: Former    Packs/day: 1.00    Years: 30.00    Total pack years: 30.00    Types: Cigarettes    Quit date: 02/22/2021    Years since quitting: 0.8   Smokeless tobacco: Never  Vaping Use   Vaping Use: Never used  Substance and Sexual Activity   Alcohol use: Never   Drug use: Never   Sexual activity: Yes    Birth control/protection: None  Other Topics Concern   Not on file  Social History Narrative   Not on file   Social Determinants of Health   Financial Resource Strain: Low Risk  (09/02/2021)   Overall Financial Resource Strain (CARDIA)    Difficulty of Paying Living Expenses: Not hard at all  Food Insecurity: No Food Insecurity (09/02/2021)   Hunger Vital Sign    Worried About Running Out of Food in the Last Year: Never true    Ran Out of Food in the Last Year: Never true  Transportation Needs: No Transportation Needs (09/02/2021)   PRAPARE - Administrator, Civil Service (Medical): No    Lack of Transportation (Non-Medical): No  Physical Activity: Sufficiently Active (09/02/2021)   Exercise Vital Sign    Days of Exercise per Week: 5 days    Minutes of Exercise per Session: 150+ min  Stress: No Stress Concern Present (09/02/2021)   [employer] of Occupational Health - Occupational Stress Questionnaire     Feeling of Stress : Not at all  Social Connections: Moderately Isolated (09/02/2021)   Social Connection and Isolation Panel [NHANES]    Frequency of Communication with Friends and Family: Three times a week    Frequency of Social Gatherings with Friends and Family: Once a week    Attends Religious Services: Never    Database administrator or Organizations: No    Attends Banker Meetings: Never    Marital Status: Married    Family History  Problem Relation Age of Onset   Hypertension Mother     Outpatient Encounter Medications as of 01/03/2022  Medication Sig   albuterol (VENTOLIN HFA) 108 (90 Base) MCG/ACT inhaler Inhale 2 puffs into the lungs every 6 (six) hours as needed for wheezing or shortness of breath.   apixaban (ELIQUIS) 5 MG TABS tablet Take 1 tablet (5 mg total) by mouth 2 (two) times daily.   benzonatate (TESSALON) 100 MG capsule Take 1 capsule (100 mg total) by mouth 2 (two) times daily as needed for cough.   citalopram (CELEXA) 10 MG tablet Take 1 tablet (10 mg total) by mouth daily.   diltiazem (CARDIZEM CD) 120 MG 24 hr capsule Take 1  capsule (120 mg total) by mouth daily.   Magnesium 250 MG TABS Take 250 mg by mouth daily.   [DISCONTINUED] levothyroxine (SYNTHROID) 25 MCG tablet Take 1 tablet (25 mcg total) by mouth daily before breakfast.   No facility-administered encounter medications on file as of 01/03/2022.    ALLERGIES: No Known Allergies  VACCINATION STATUS: Immunization History  Administered Date(s) Administered   Influenza-Unspecified 12/03/2020   Pfizer Covid-19 Vaccine Bivalent Booster 6571yrs & up 02/17/2019, 03/10/2019, 11/04/2019     HPI  Krista Ramirez is 66 y.o. female who presents today with a medical history as above. she is being seen in follow up after being seen in consultation for hyperthyroidism requested by Anabel HalonPatel, Rutwik K, MD.  she has been dealing with symptoms of anxiety, insomnia, irritability, palpitations, weight  loss, and tremors for 6-8 months. These symptoms are progressively worsening and troubling to her.  her most recent thyroid labs revealed suppressed TSH of < 0.010, high Free T3 of 26.5 and high Free T4 of 4.79 on 03/29/21. she denies dysphagia, choking, shortness of breath, no recent voice change.    she does have family history of thyroid dysfunction in her siblings, but denies family hx of thyroid cancer. she denies personal history of goiter. she is currently on Methimazole 10 mg po twice daily. Denies use of Biotin containing supplements.  she is willing to proceed with appropriate work up and therapy for thyrotoxicosis.   Review of systems  Constitutional: + Minimally fluctuating body weight,  current Body mass index is 28.46 kg/m. , no fatigue, no subjective hyperthermia, no subjective hypothermia Eyes: no blurry vision, no xerophthalmia ENT: no sore throat, no nodules palpated in throat, no dysphagia/odynophagia, no hoarseness Cardiovascular: no chest pain, no shortness of breath, no palpitations, no leg swelling Respiratory: no cough, no shortness of breath Gastrointestinal: no nausea/vomiting/diarrhea Musculoskeletal: no muscle/joint aches Skin: no rashes, no hyperemia Neurological: no tremors, no numbness, no tingling, no dizziness Psychiatric: no depression, no anxiety   Objective:    BP 110/68 (BP Location: Left Arm, Patient Position: Sitting, Cuff Size: Normal)   Pulse 70   Ht 5' 2.4" (1.585 m)   Wt 157 lb 9.6 oz (71.5 kg)   BMI 28.46 kg/m   Wt Readings from Last 3 Encounters:  01/03/22 157 lb 9.6 oz (71.5 kg)  12/09/21 156 lb 3.2 oz (70.9 kg)  11/02/21 160 lb (72.6 kg)     BP Readings from Last 3 Encounters:  01/03/22 110/68  12/09/21 136/86  11/02/21 128/79     Physical Exam- Limited  Constitutional:  Body mass index is 28.46 kg/m. , not in acute distress, anxious state of mind Eyes:  EOMI, no exophthalmos Neck: Supple Cardiovascular: RRR, no murmurs,  rubs, or gallops, no edema Respiratory: Adequate breathing efforts, no crackles, rales, rhonchi, or wheezing Musculoskeletal: no gross deformities, strength intact in all four extremities, no gross restriction of joint movements Skin:  no rashes, no hyperemia Neurological: no tremor with outstretched hands   CMP     Component Value Date/Time   NA 143 12/09/2021 1600   K 4.7 12/09/2021 1600   CL 107 (H) 12/09/2021 1600   CO2 22 12/09/2021 1600   GLUCOSE 100 (H) 12/09/2021 1600   GLUCOSE 105 (H) 03/30/2021 0438   BUN 12 12/09/2021 1600   CREATININE 0.80 12/09/2021 1600   CALCIUM 9.5 12/09/2021 1600   PROT 7.0 12/09/2021 1600   ALBUMIN 4.2 12/09/2021 1600   AST 17 12/09/2021 1600   ALT 20  12/09/2021 1600   ALKPHOS 233 (H) 12/09/2021 1600   BILITOT 0.2 12/09/2021 1600   GFRNONAA >60 03/30/2021 0438     CBC    Component Value Date/Time   WBC 4.9 12/09/2021 1600   WBC 6.4 04/28/2021 0806   RBC 3.99 12/09/2021 1600   RBC 3.79 (L) 04/28/2021 0806   HGB 10.7 (L) 12/09/2021 1600   HCT 34.2 12/09/2021 1600   PLT 334 12/09/2021 1600   MCV 86 12/09/2021 1600   MCH 26.8 12/09/2021 1600   MCH 26.6 04/28/2021 0806   MCHC 31.3 (L) 12/09/2021 1600   MCHC 30.2 04/28/2021 0806   RDW 13.7 12/09/2021 1600   LYMPHSABS 1.7 12/09/2021 1600   EOSABS 0.0 12/09/2021 1600   BASOSABS 0.0 12/09/2021 1600     Diabetic Labs (most recent): No results found for: "HGBA1C", "MICROALBUR"  Lipid Panel  No results found for: "CHOL", "TRIG", "HDL", "CHOLHDL", "VLDL", "LDLCALC", "LDLDIRECT", "LABVLDL"   Lab Results  Component Value Date   TSH <0.005 (L) 12/30/2021   TSH <0.005 (L) 10/27/2021   TSH <0.005 (L) 08/25/2021   TSH 0.108 (L) 06/28/2021   TSH <0.005 (L) 04/20/2021   TSH <0.010 (L) 03/29/2021   FREET4 2.10 (H) 12/30/2021   FREET4 2.12 (H) 10/27/2021   FREET4 1.66 08/25/2021   FREET4 0.38 (L) 06/28/2021   FREET4 0.75 (L) 04/20/2021   FREET4 4.79 (H) 03/29/2021       Latest  Reference Range & Units 03/29/21 08:16 04/20/21 13:49  TSH 0.450 - 4.500 uIU/mL <0.010 (L) <0.005 (L)  Triiodothyronine,Free,Serum 2.0 - 4.4 pg/mL 26.5 (H) 4.2  T4,Free(Direct) 0.82 - 1.77 ng/dL 2.59 (H) 5.63 (L)  Thyroperoxidase Ab SerPl-aCnc 0 - 34 IU/mL  210 (H)  Thyroglobulin Antibody 0.0 - 0.9 IU/mL  1,505.7 (H)  (L): Data is abnormally low (H): Data is abnormally high   Uptake and Scan from 04/29/21 CLINICAL DATA:  Hyperthyroidism. TSH equal 0.005. Nervousness. And increased appetite. Difficulty sleeping. Hair loss. Heart palpitations.   EXAM: THYROID SCAN AND UPTAKE - 4 AND 24 HOURS   TECHNIQUE: Following oral administration of I-123 capsule, anterior planar imaging was acquired at 24 hours. Thyroid uptake was calculated with a thyroid probe at 4-6 hours and 24 hours.   RADIOPHARMACEUTICALS:  Three hundred uCi I-123 sodium iodide p.o.   COMPARISON:  None.   FINDINGS: Uniform uptake within thyroid gland.  No nodularity.   4 hour I-123 uptake = 71.8% (normal 5-20%)   24 hour I-123 uptake = 59.4% (normal 10-30%)   IMPRESSION: 1. Thyroid imaging and I 123 uptake consistent with Graves disease. 2. Rapid turnover of I 123 at 4 hour uptake.     Electronically Signed   By: Genevive Bi M.D.   On: 04/29/2021 16:05   Latest Reference Range & Units 03/29/21 08:16 04/20/21 13:49 06/28/21 13:27 08/25/21 08:12 10/27/21 08:36 12/30/21 16:25  TSH 0.450 - 4.500 uIU/mL <0.010 (L) <0.005 (L) 0.108 (L) <0.005 (L) <0.005 (L) <0.005 (L)  Triiodothyronine,Free,Serum 2.0 - 4.4 pg/mL 26.5 (H) 4.2 1.7 (L)     T4,Free(Direct) 0.82 - 1.77 ng/dL 8.75 (H) 6.43 (L) 3.29 (L) 1.66 2.12 (H) 2.10 (H)  Thyroperoxidase Ab SerPl-aCnc 0 - 34 IU/mL  210 (H)      Thyroglobulin Antibody 0.0 - 0.9 IU/mL  1,505.7 (H)      (L): Data is abnormally low (H): Data is abnormally high Assessment & Plan:   1) Hypothyroidism- S/p RAI ablation for Graves Disease  she is being seen at  a kind request of  Anabel Halon, MD.  She is S/P RAI ablation on 05/07/21.   Her previsit thyroid function tests are consistent with over-replacement.  I advised her to stop the thyroid hormone altogether at this time.  Will need to repeat her thyroid labs in 2 months to reassess.  It is possible that her thyroid did not respond to this first ablation therapy, which is more rare, but certainly a possibility.  She does not take any supplements other than Magnesium.   She asks about whether or not she needs to stay on the Cardizem. I advised her to continue for now until we get her thyroid to goal.       -Patient is advised to maintain close follow up with Anabel Halon, MD for primary care needs.     I spent 32 minutes in the care of the patient today including review of labs from Thyroid Function, CMP, and other relevant labs ; imaging/biopsy records (current and previous including abstractions from other facilities); face-to-face time discussing  her lab results and symptoms, medications doses, her options of short and long term treatment based on the latest standards of care / guidelines;   and documenting the encounter.  Krista Ramirez  participated in the discussions, expressed understanding, and voiced agreement with the above plans.  All questions were answered to her satisfaction. she is encouraged to contact clinic should she have any questions or concerns prior to her return visit.  Follow up plan: Return in about 2 months (around 03/05/2022) for Thyroid follow up, Previsit labs.   Thank you for involving me in the care of this pleasant patient, and I will continue to update you with her progress.   Ronny Bacon, [facility] [address] [place], [place] [address] Phone: [phone] Fax: [phone]  01/03/2022, 4:18 PM

## 2022-01-03 NOTE — Patient Instructions (Signed)

## 2022-01-13 ENCOUNTER — Ambulatory Visit: Payer: Medicare Other | Attending: Student | Admitting: Student

## 2022-01-13 ENCOUNTER — Encounter: Payer: Self-pay | Admitting: Student

## 2022-01-13 VITALS — BP 130/78 | HR 76 | Ht 62.5 in | Wt 157.8 lb

## 2022-01-13 DIAGNOSIS — Z7901 Long term (current) use of anticoagulants: Secondary | ICD-10-CM | POA: Insufficient documentation

## 2022-01-13 DIAGNOSIS — E0590 Thyrotoxicosis, unspecified without thyrotoxic crisis or storm: Secondary | ICD-10-CM | POA: Diagnosis present

## 2022-01-13 DIAGNOSIS — I480 Paroxysmal atrial fibrillation: Secondary | ICD-10-CM | POA: Insufficient documentation

## 2022-01-13 NOTE — Patient Instructions (Signed)
Medication Instructions:   Take Cardizem at bedtime to help with daytime fatigue.   Labwork: None today  Testing/Procedures: None today  Follow-Up: 6 months  Any Other Special Instructions Will Be Listed Below (If Applicable).  If you need a refill on your cardiac medications before your next appointment, please call your pharmacy.

## 2022-01-13 NOTE — Progress Notes (Signed)
Cardiology Office Note    Date:  01/13/2022   ID:  Krista Ramirez, DOB 1955-11-02, MRN [id number]  PCP:  Anabel Halon, MD  Cardiologist: Dina Rich, MD    Chief Complaint  Patient presents with   Follow-up    6 month visit    History of Present Illness:    Krista Ramirez is a 66 y.o. female with past medical history of paroxysmal atrial fibrillation (diagnosed in 03/2021 in the setting of hyperthyroidism and having been off Methimazole), COPD and hyperthyroidism who presents to the office today for 17-month follow-up.  She was examined by Dr. Anne Fu in 07/2021 and reported having undergone radioactive iodine thyroid ablation and was overall doing well with no recent chest pain or palpitations. She was continued on her current cardiac medications including Eliquis 5 mg twice daily and Cardizem CD 120 mg daily.  In talking with the patient and her husband today, she reports overall doing well since her last office visit. She denies any recent palpitations but she was asymptomatic with her atrial fibrillation in the past. She reports feeling better since Synthroid was recently stopped by Endocrinology and she is hopeful her levels will normalize so she does not require repeat ablation. She denies any chest pain or progressive dyspnea on exertion. No specific orthopnea, PND or pitting edema. She remains on Eliquis for anticoagulation with no reports of melena, hematochezia or hematuria.   Past Medical History:  Diagnosis Date   Anxiety 03/17/2021   COPD (chronic obstructive pulmonary disease) (HCC)    Hyperthyroidism     Past Surgical History:  Procedure Laterality Date   SKIN GRAFT     TUBAL LIGATION      Current Medications: Outpatient Medications Prior to Visit  Medication Sig Dispense Refill   albuterol (VENTOLIN HFA) 108 (90 Base) MCG/ACT inhaler Inhale 2 puffs into the lungs every 6 (six) hours as needed for wheezing or shortness of breath. 8 g 5   apixaban  (ELIQUIS) 5 MG TABS tablet Take 1 tablet (5 mg total) by mouth 2 (two) times daily. 60 tablet 5   benzonatate (TESSALON) 100 MG capsule Take 1 capsule (100 mg total) by mouth 2 (two) times daily as needed for cough. 20 capsule 1   citalopram (CELEXA) 10 MG tablet Take 1 tablet (10 mg total) by mouth daily. 30 tablet 5   diltiazem (CARDIZEM CD) 120 MG 24 hr capsule Take 1 capsule (120 mg total) by mouth daily. 30 capsule 5   Magnesium 250 MG TABS Take 250 mg by mouth daily.     No facility-administered medications prior to visit.     Allergies:   Patient has no known allergies.   Social History   Socioeconomic History   Marital status: Married    Spouse name: Not on file   Number of children: Not on file   Years of education: Not on file   Highest education level: Not on file  Occupational History   Not on file  Tobacco Use   Smoking status: Former    Packs/day: 1.00    Years: 30.00    Total pack years: 30.00    Types: Cigarettes    Quit date: 02/22/2021    Years since quitting: 0.8   Smokeless tobacco: Never  Vaping Use   Vaping Use: Never used  Substance and Sexual Activity   Alcohol use: Never   Drug use: Never   Sexual activity: Yes    Birth control/protection: None  Other Topics Concern  Not on file  Social History Narrative   Not on file   Social Determinants of Health   Financial Resource Strain: Low Risk  (09/02/2021)   Overall Financial Resource Strain (CARDIA)    Difficulty of Paying Living Expenses: Not hard at all  Food Insecurity: No Food Insecurity (09/02/2021)   Hunger Vital Sign    Worried About Running Out of Food in the Last Year: Never true    Ran Out of Food in the Last Year: Never true  Transportation Needs: No Transportation Needs (09/02/2021)   PRAPARE - Administrator, Civil Service (Medical): No    Lack of Transportation (Non-Medical): No  Physical Activity: Sufficiently Active (09/02/2021)   Exercise Vital Sign    Days of  Exercise per Week: 5 days    Minutes of Exercise per Session: 150+ min  Stress: No Stress Concern Present (09/02/2021)   [employer] of Occupational Health - Occupational Stress Questionnaire    Feeling of Stress : Not at all  Social Connections: Moderately Isolated (09/02/2021)   Social Connection and Isolation Panel [NHANES]    Frequency of Communication with Friends and Family: Three times a week    Frequency of Social Gatherings with Friends and Family: Once a week    Attends Religious Services: Never    Database administrator or Organizations: No    Attends Engineer, structural: Never    Marital Status: Married     Family History:  The patient's family history includes Hypertension in her mother.   Review of Systems:    Please see the history of present illness.     All other systems reviewed and are otherwise negative except as noted above.   Physical Exam:    VS:  BP 130/78   Pulse 76   Ht 5' 2.5" (1.588 m)   Wt 157 lb 12.8 oz (71.6 kg)   SpO2 95%   BMI 28.40 kg/m    General: Well developed, well nourished,female appearing in no acute distress. Head: Normocephalic, atraumatic. Neck: No carotid bruits. JVD not elevated.  Lungs: Respirations regular and unlabored, without wheezes or rales.  Heart: Regular rate and rhythm. No S3 or S4.  No murmur, no rubs, or gallops appreciated. Abdomen: Appears non-distended. No obvious abdominal masses. Msk:  Strength and tone appear normal for age. No obvious joint deformities or effusions. Extremities: No clubbing or cyanosis. No pitting edema.  Distal pedal pulses are 2+ bilaterally. Neuro: Alert and oriented X 3. Moves all extremities spontaneously. No focal deficits noted. Psych:  Responds to questions appropriately with a normal affect. Skin: No rashes or lesions noted  Wt Readings from Last 3 Encounters:  01/13/22 157 lb 12.8 oz (71.6 kg)  01/03/22 157 lb 9.6 oz (71.5 kg)  12/09/21 156 lb 3.2 oz (70.9 kg)      Studies/Labs Reviewed:   EKG:  EKG is not ordered today.   Recent Labs: 03/29/2021: B Natriuretic Peptide 628.0 03/30/2021: Magnesium 2.0 12/09/2021: ALT 20; BUN 12; Creatinine, Ser 0.80; Hemoglobin 10.7; Platelets 334; Potassium 4.7; Sodium 143 12/30/2021: TSH <0.005   Lipid Panel No results found for: "CHOL", "TRIG", "HDL", "CHOLHDL", "VLDL", "LDLCALC", "LDLDIRECT"  Additional studies/ records that were reviewed today include:   Echocardiogram: 03/2021 IMPRESSIONS     1. Left ventricular ejection fraction, by estimation, is 60 to 65%. The  left ventricle has normal function. The left ventricle has no regional  wall motion abnormalities. Left ventricular diastolic parameters are  indeterminate.  2. Right ventricular systolic function is normal. The right ventricular  size is normal. There is normal pulmonary artery systolic pressure.   3. Left atrial size was mildly dilated.   4. The mitral valve is normal in structure. Mild mitral valve  regurgitation. No evidence of mitral stenosis.   5. The aortic valve is tricuspid. Aortic valve regurgitation is mild. No  aortic stenosis is present.   6. The inferior vena cava is normal in size with greater than 50%  respiratory variability, suggesting right atrial pressure of 3 mmHg.    Assessment:    1. Paroxysmal atrial fibrillation (HCC)   2. Encounter for current long-term use of anticoagulants   3. Hyperthyroidism      Plan:   In order of problems listed above:  1. Paroxysmal Atrial Fibrillation/Use of Long-term Anticoagulation - She had one isolated episode of known atrial fibrillation in 03/2021 in the setting of hyperthyroidism. She has since undergone radioactive ablation as outlined above but continues to have issues with hyperthyroidism which is being managed by Endocrinology. They did ask about stopping her cardiac medications but I recommended that we continue both her Cardizem CD 120 mg daily and Eliquis 5 mg  twice daily for now since she is at high-risk for recurrence given issues with hyperthyroidism. Once this normalizes, could consider a 30-day monitor to assess for recurrent atrial fibrillation and if no recurrence, possibly stop the medications at that time.  2. Hyperthyroidism - She underwent radioactive iodine ablation in 04/2021. Most recent labs showed her TSH was still less than 0.005 with Free T4 elevated to 2.10. She has now been taken off Levothyroxine with plans for follow-up labs in 2 months.    Medication Adjustments/Labs and Tests Ordered: Current medicines are reviewed at length with the patient today.  Concerns regarding medicines are outlined above.  Medication changes, Labs and Tests ordered today are listed in the Patient Instructions below. Patient Instructions  Medication Instructions:   Take Cardizem at bedtime to help with daytime fatigue.   Labwork: None today  Testing/Procedures: None today  Follow-Up: 6 months  Any Other Special Instructions Will Be Listed Below (If Applicable).  If you need a refill on your cardiac medications before your next appointment, please call your pharmacy.    Signed, Ellsworth Lennox, PA-C  01/13/2022 4:50 PM     [facility] [address] [place], [place] [address] Phone: [phone] Fax: [phone]

## 2022-02-15 ENCOUNTER — Other Ambulatory Visit: Payer: Self-pay | Admitting: Internal Medicine

## 2022-02-15 DIAGNOSIS — F411 Generalized anxiety disorder: Secondary | ICD-10-CM

## 2022-02-15 MED ORDER — CITALOPRAM HYDROBROMIDE 10 MG PO TABS: 10.0000 mg | ORAL_TABLET | Freq: Every day | ORAL | 1 refills | Status: DC

## 2022-02-22 NOTE — Progress Notes (Signed)
[facility] referral received. I have attempted to reach the patient regarding referral but was unable to reach the patient directly. Detailed VM left asking that the patient return my call. 

## 2022-03-04 LAB — T4, FREE: Free T4: 1.62 ng/dL (ref 0.82–1.77)

## 2022-03-04 LAB — T3, FREE: T3, Free: 5.7 pg/mL — ABNORMAL HIGH (ref 2.0–4.4)

## 2022-03-04 LAB — TSH: TSH: <0.005 u[IU]/mL — ABNORMAL LOW (ref 0.450–4.500)

## 2022-03-07 ENCOUNTER — Encounter: Payer: Self-pay | Admitting: Nurse Practitioner

## 2022-03-07 ENCOUNTER — Ambulatory Visit (INDEPENDENT_AMBULATORY_CARE_PROVIDER_SITE_OTHER): Payer: Medicare Other | Admitting: Nurse Practitioner

## 2022-03-07 VITALS — BP 140/82 | HR 70 | Ht 62.5 in | Wt 157.8 lb

## 2022-03-07 DIAGNOSIS — E890 Postprocedural hypothyroidism: Secondary | ICD-10-CM

## 2022-03-07 NOTE — Progress Notes (Signed)
03/07/2022     Endocrinology Follow Up Note     Subjective:    Patient ID: Krista Ramirez, female    DOB: 11/01/1955, PCP Anabel Halon, MD.   Past Medical History:  Diagnosis Date   Anxiety 03/17/2021   COPD (chronic obstructive pulmonary disease) (HCC)    Hyperthyroidism     Past Surgical History:  Procedure Laterality Date   SKIN GRAFT     TUBAL LIGATION      Social History   Socioeconomic History   Marital status: Married    Spouse name: Not on file   Number of children: Not on file   Years of education: Not on file   Highest education level: Not on file  Occupational History   Not on file  Tobacco Use   Smoking status: Former    Packs/day: 1.00    Years: 30.00    Total pack years: 30.00    Types: Cigarettes    Quit date: 02/22/2021    Years since quitting: 1.0   Smokeless tobacco: Never  Vaping Use   Vaping Use: Never used  Substance and Sexual Activity   Alcohol use: Never   Drug use: Never   Sexual activity: Yes    Birth control/protection: None  Other Topics Concern   Not on file  Social History Narrative   Not on file   Social Determinants of Health   Financial Resource Strain: Low Risk  (09/02/2021)   Overall Financial Resource Strain (CARDIA)    Difficulty of Paying Living Expenses: Not hard at all  Food Insecurity: No Food Insecurity (09/02/2021)   Hunger Vital Sign    Worried About Running Out of Food in the Last Year: Never true    Ran Out of Food in the Last Year: Never true  Transportation Needs: No Transportation Needs (09/02/2021)   PRAPARE - Administrator, Civil Service (Medical): No    Lack of Transportation (Non-Medical): No  Physical Activity: Sufficiently Active (09/02/2021)   Exercise Vital Sign    Days of Exercise per Week: 5 days    Minutes of Exercise per Session: 150+ min  Stress: No Stress Concern Present (09/02/2021)   [employer] of Occupational Health - Occupational Stress Questionnaire     Feeling of Stress : Not at all  Social Connections: Moderately Isolated (09/02/2021)   Social Connection and Isolation Panel [NHANES]    Frequency of Communication with Friends and Family: Three times a week    Frequency of Social Gatherings with Friends and Family: Once a week    Attends Religious Services: Never    Database administrator or Organizations: No    Attends Banker Meetings: Never    Marital Status: Married    Family History  Problem Relation Age of Onset   Hypertension Mother     Outpatient Encounter Medications as of 03/07/2022  Medication Sig   albuterol (VENTOLIN HFA) 108 (90 Base) MCG/ACT inhaler Inhale 2 puffs into the lungs every 6 (six) hours as needed for wheezing or shortness of breath.   apixaban (ELIQUIS) 5 MG TABS tablet Take 1 tablet (5 mg total) by mouth 2 (two) times daily.   citalopram (CELEXA) 10 MG tablet Take 1 tablet (10 mg total) by mouth daily.   Magnesium 250 MG TABS Take 250 mg by mouth daily.   benzonatate (TESSALON) 100 MG capsule Take 1 capsule (100 mg total) by mouth 2 (two) times daily as needed for cough. (  Patient not taking: Reported on 03/07/2022)   diltiazem (CARDIZEM CD) 120 MG 24 hr capsule Take 1 capsule (120 mg total) by mouth daily.   No facility-administered encounter medications on file as of 03/07/2022.    ALLERGIES: No Known Allergies  VACCINATION STATUS: Immunization History  Administered Date(s) Administered   Influenza-Unspecified 12/03/2020   Pfizer Covid-19 Vaccine Bivalent Booster 46yrs & up 02/17/2019, 03/10/2019, 11/04/2019     HPI  Krista Ramirez is 67 y.o. female who presents today with a medical history as above. she is being seen in follow up after being seen in consultation for hyperthyroidism requested by Anabel Halon, MD.  she has been dealing with symptoms of anxiety, insomnia, irritability, palpitations, weight loss, and tremors for 6-8 months. These symptoms are progressively worsening and  troubling to her.  her most recent thyroid labs revealed suppressed TSH of < 0.010, high Free T3 of 26.5 and high Free T4 of 4.79 on 03/29/21. she denies dysphagia, choking, shortness of breath, no recent voice change.    she does have family history of thyroid dysfunction in her siblings, but denies family hx of thyroid cancer. she denies personal history of goiter. she is currently on Methimazole 10 mg po twice daily. Denies use of Biotin containing supplements.  she is willing to proceed with appropriate work up and therapy for thyrotoxicosis.   Review of systems  Constitutional: + Minimally fluctuating body weight,  current Body mass index is 28.4 kg/m. , no fatigue, no subjective hyperthermia, no subjective hypothermia Eyes: no blurry vision, no xerophthalmia ENT: no sore throat, no nodules palpated in throat, no dysphagia/odynophagia, no hoarseness Cardiovascular: no chest pain, no shortness of breath, + intermittent palpitations, no leg swelling Respiratory: no cough, no shortness of breath Gastrointestinal: no nausea/vomiting/diarrhea Musculoskeletal: no muscle/joint aches Skin: no rashes, no hyperemia Neurological: no tremors, no numbness, no tingling, no dizziness Psychiatric: no depression, + anxiety, + insomnia   Objective:    BP (!) 140/82 (BP Location: Right Arm, Patient Position: Sitting, Cuff Size: Normal)   Pulse 70   Ht 5' 2.5" (1.588 m)   Wt 157 lb 12.8 oz (71.6 kg)   BMI 28.40 kg/m   Wt Readings from Last 3 Encounters:  03/07/22 157 lb 12.8 oz (71.6 kg)  01/13/22 157 lb 12.8 oz (71.6 kg)  01/03/22 157 lb 9.6 oz (71.5 kg)     BP Readings from Last 3 Encounters:  03/07/22 (!) 140/82  01/13/22 130/78  01/03/22 110/68     Physical Exam- Limited  Constitutional:  Body mass index is 28.4 kg/m. , not in acute distress, anxious state of mind Eyes:  EOMI, no exophthalmos Musculoskeletal: no gross deformities, strength intact in all four extremities, no gross  restriction of joint movements Skin:  no rashes, no hyperemia Neurological: no tremor with outstretched hands   CMP     Component Value Date/Time   NA 143 12/09/2021 1600   K 4.7 12/09/2021 1600   CL 107 (H) 12/09/2021 1600   CO2 22 12/09/2021 1600   GLUCOSE 100 (H) 12/09/2021 1600   GLUCOSE 105 (H) 03/30/2021 0438   BUN 12 12/09/2021 1600   CREATININE 0.80 12/09/2021 1600   CALCIUM 9.5 12/09/2021 1600   PROT 7.0 12/09/2021 1600   ALBUMIN 4.2 12/09/2021 1600   AST 17 12/09/2021 1600   ALT 20 12/09/2021 1600   ALKPHOS 233 (H) 12/09/2021 1600   BILITOT 0.2 12/09/2021 1600   GFRNONAA >60 03/30/2021 0438     CBC  Component Value Date/Time   WBC 4.9 12/09/2021 1600   WBC 6.4 04/28/2021 0806   RBC 3.99 12/09/2021 1600   RBC 3.79 (L) 04/28/2021 0806   HGB 10.7 (L) 12/09/2021 1600   HCT 34.2 12/09/2021 1600   PLT 334 12/09/2021 1600   MCV 86 12/09/2021 1600   MCH 26.8 12/09/2021 1600   MCH 26.6 04/28/2021 0806   MCHC 31.3 (L) 12/09/2021 1600   MCHC 30.2 04/28/2021 0806   RDW 13.7 12/09/2021 1600   LYMPHSABS 1.7 12/09/2021 1600   EOSABS 0.0 12/09/2021 1600   BASOSABS 0.0 12/09/2021 1600     Diabetic Labs (most recent): No results found for: "HGBA1C", "MICROALBUR"  Lipid Panel  No results found for: "CHOL", "TRIG", "HDL", "CHOLHDL", "VLDL", "LDLCALC", "LDLDIRECT", "LABVLDL"   Lab Results  Component Value Date   TSH <0.005 (L) 03/03/2022   TSH <0.005 (L) 12/30/2021   TSH <0.005 (L) 10/27/2021   TSH <0.005 (L) 08/25/2021   TSH 0.108 (L) 06/28/2021   TSH <0.005 (L) 04/20/2021   TSH <0.010 (L) 03/29/2021   FREET4 1.62 03/03/2022   FREET4 2.10 (H) 12/30/2021   FREET4 2.12 (H) 10/27/2021   FREET4 1.66 08/25/2021   FREET4 0.38 (L) 06/28/2021   FREET4 0.75 (L) 04/20/2021   FREET4 4.79 (H) 03/29/2021       Latest Reference Range & Units 03/29/21 08:16 04/20/21 13:49  TSH 0.450 - 4.500 uIU/mL <0.010 (L) <0.005 (L)  Triiodothyronine,Free,Serum 2.0 - 4.4 pg/mL  26.5 (H) 4.2  T4,Free(Direct) 0.82 - 1.77 ng/dL 4.79 (H) 0.75 (L)  Thyroperoxidase Ab SerPl-aCnc 0 - 34 IU/mL  210 (H)  Thyroglobulin Antibody 0.0 - 0.9 IU/mL  1,505.7 (H)  (L): Data is abnormally low (H): Data is abnormally high   Uptake and Scan from 04/29/21 CLINICAL DATA:  Hyperthyroidism. TSH equal 0.005. Nervousness. And increased appetite. Difficulty sleeping. Hair loss. Heart palpitations.   EXAM: THYROID SCAN AND UPTAKE - 4 AND 24 HOURS   TECHNIQUE: Following oral administration of I-123 capsule, anterior planar imaging was acquired at 24 hours. Thyroid uptake was calculated with a thyroid probe at 4-6 hours and 24 hours.   RADIOPHARMACEUTICALS:  Three hundred uCi I-123 sodium iodide p.o.   COMPARISON:  None.   FINDINGS: Uniform uptake within thyroid gland.  No nodularity.   4 hour I-123 uptake = 71.8% (normal 5-20%)   24 hour I-123 uptake = 59.4% (normal 10-30%)   IMPRESSION: 1. Thyroid imaging and I 123 uptake consistent with Graves disease. 2. Rapid turnover of I 123 at 4 hour uptake.     Electronically Signed   By: Suzy Bouchard M.D.   On: 04/29/2021 16:05   Latest Reference Range & Units 10/27/21 08:36 12/30/21 16:25 03/03/22 10:46  TSH 0.450 - 4.500 uIU/mL <0.005 (L) <0.005 (L) <0.005 (L)  Triiodothyronine,Free,Serum 2.0 - 4.4 pg/mL   5.7 (H)  T4,Free(Direct) 0.82 - 1.77 ng/dL 2.12 (H) 2.10 (H) 1.62  (L): Data is abnormally low (H): Data is abnormally high Assessment & Plan:   1) Hypothyroidism- S/p RAI ablation for Graves Disease  she is being seen at a kind request of Lindell Spar, MD.  She is S/P RAI ablation on 05/07/21.   Her previsit thyroid function tests are consistent with hyperactivity despite stopping her thyroid hormone replacement. It is possible that her thyroid did not respond to this first ablation therapy, which is more rare, but certainly a possibility.  Will need to repeat her uptake and scan to reassess and plan  accordingly.   She asks about whether or not she needs to stay on the Cardizem. I advised her to continue for now until we get her thyroid to goal.       -Patient is advised to maintain close follow up with Lindell Spar, MD for primary care needs.     I spent 34 minutes in the care of the patient today including review of labs from Thyroid Function, CMP, and other relevant labs ; imaging/biopsy records (current and previous including abstractions from other facilities); face-to-face time discussing  her lab results and symptoms, medications doses, her options of short and long term treatment based on the latest standards of care / guidelines;   and documenting the encounter.  Delorise Royals  participated in the discussions, expressed understanding, and voiced agreement with the above plans.  All questions were answered to her satisfaction. she is encouraged to contact clinic should she have any questions or concerns prior to her return visit.  Follow up plan: Return when results of uptake and scan are back.   Thank you for involving me in the care of this pleasant patient, and I will continue to update you with her progress.   Rayetta Pigg, [facility] [address] [place], [place] [address] Phone: [phone] Fax: [phone]  03/07/2022, 4:15 PM

## 2022-03-08 ENCOUNTER — Encounter (HOSPITAL_COMMUNITY): Payer: Self-pay

## 2022-03-08 ENCOUNTER — Emergency Department (HOSPITAL_COMMUNITY)
Admission: EM | Admit: 2022-03-08 | Discharge: 2022-03-08 | Disposition: A | Discharge status: Home / Self Care | Payer: Medicare Other | Attending: Emergency Medicine | Admitting: Emergency Medicine

## 2022-03-08 ENCOUNTER — Other Ambulatory Visit: Payer: Self-pay

## 2022-03-08 DIAGNOSIS — M791 Myalgia, unspecified site: Secondary | ICD-10-CM | POA: Diagnosis present

## 2022-03-08 DIAGNOSIS — Z7901 Long term (current) use of anticoagulants: Secondary | ICD-10-CM | POA: Diagnosis not present

## 2022-03-08 DIAGNOSIS — J449 Chronic obstructive pulmonary disease, unspecified: Secondary | ICD-10-CM | POA: Insufficient documentation

## 2022-03-08 DIAGNOSIS — I4891 Unspecified atrial fibrillation: Secondary | ICD-10-CM | POA: Insufficient documentation

## 2022-03-08 DIAGNOSIS — E039 Hypothyroidism, unspecified: Secondary | ICD-10-CM | POA: Insufficient documentation

## 2022-03-08 DIAGNOSIS — U071 COVID-19: Secondary | ICD-10-CM | POA: Insufficient documentation

## 2022-03-08 LAB — RESP PANEL BY RT-PCR (RSV, FLU A&B, COVID)  RVPGX2
Influenza A by PCR: NEGATIVE
Influenza B by PCR: NEGATIVE
Resp Syncytial Virus by PCR: NEGATIVE
SARS Coronavirus 2 by RT PCR: POSITIVE — AB

## 2022-03-08 MED ORDER — BENZONATATE 200 MG PO CAPS: 200.0000 mg | ORAL_CAPSULE | Freq: Three times a day (TID) | ORAL | 0 refills | Status: DC | PRN

## 2022-03-08 NOTE — ED Triage Notes (Signed)
Pt reports fatigue, headache, fever, cough, nausea since yesterday.

## 2022-03-08 NOTE — Discharge Instructions (Signed)
Your COVID test today is positive.  I recommend rest, drink plenty of fluids.  Tylenol every 4 hours for body aches and/or fever.  You have been prescribed Tessalon to help with your cough.  I recommend that you isolate at home for the first 5 days.  After 5 days if you are feeling better and have been at least 24 hours without fever and without taking fever reducing medication you may return to normal activities but continue to wear a mask while around others for an additional 5 days.  Follow-up with your primary care provider for recheck or return to the emergency department for any new or worsening symptoms.

## 2022-03-08 NOTE — ED Provider Notes (Signed)
[facility] Provider Note   CSN: [id number] Arrival date & time: 03/08/22  1426     History  Chief Complaint  Patient presents with   flu like symptoms    Krista Ramirez is a 67 y.o. female.  HPI      Krista Ramirez is a 67 y.o. female past medical history of COPD, hypothyroidism and atrial fibrillation anticoagulated on Eliquis who presents to the Emergency Department complaining of generalized bodyaches, frontal headache, fatigue, cough, fever and nausea.  Symptoms began yesterday.  She reports couple of episodes of mild vomiting as well.  Cough has been mostly nonproductive.  Headache described as gradual in onset and throbbing pounding sensation to the front of her head.  She took Tylenol last evening for her fever with some mild improvement.  No improvement of her body aches.  Today, having intermittent chills.  Max temp of 102 yesterday.  Denies any neck pain or stiffness, diarrhea, abdominal pain, chest pain or shortness of breath.   Home Medications Prior to Admission medications   Medication Sig Start Date End Date Taking? Authorizing Provider  albuterol (VENTOLIN HFA) 108 (90 Base) MCG/ACT inhaler Inhale 2 puffs into the lungs every 6 (six) hours as needed for wheezing or shortness of breath. 04/08/21   Lindell Spar, MD  apixaban (ELIQUIS) 5 MG TABS tablet Take 1 tablet (5 mg total) by mouth 2 (two) times daily. 11/29/21   Lindell Spar, MD  benzonatate (TESSALON) 100 MG capsule Take 1 capsule (100 mg total) by mouth 2 (two) times daily as needed for cough. Patient not taking: Reported on 03/07/2022 12/22/21   Lindell Spar, MD  citalopram (CELEXA) 10 MG tablet Take 1 tablet (10 mg total) by mouth daily. 02/15/22   Lindell Spar, MD  diltiazem (CARDIZEM CD) 120 MG 24 hr capsule Take 1 capsule (120 mg total) by mouth daily. 11/18/21   Lindell Spar, MD  Magnesium 250 MG TABS Take 250 mg by mouth daily.    [provider]      Allergies    Patient has no known allergies.    Review of Systems   Review of Systems  Constitutional:  Positive for chills, fatigue and fever. Negative for appetite change.  HENT:  Negative for congestion, sore throat and trouble swallowing.   Respiratory:  Positive for cough. Negative for shortness of breath.   Cardiovascular:  Negative for chest pain.  Gastrointestinal:  Positive for nausea and vomiting. Negative for abdominal pain and diarrhea.  Genitourinary:  Negative for dysuria and flank pain.  Musculoskeletal:  Positive for myalgias. Negative for arthralgias, neck pain and neck stiffness.  Skin:  Negative for rash.  Neurological:  Positive for headaches. Negative for dizziness, weakness and numbness.  Psychiatric/Behavioral:  Negative for confusion.     Physical Exam Updated Vital Signs BP 122/77 (BP Location: Right Arm)   Pulse 92   Temp 98.7 F (37.1 C) (Oral)   Resp 18   Ht 5' 2.5" (1.588 m)   Wt 71.2 kg   SpO2 95%   BMI 28.26 kg/m  Physical Exam Vitals and nursing note reviewed.  Constitutional:      General: She is not in acute distress.    Appearance: Normal appearance. She is not ill-appearing or toxic-appearing.  HENT:     Right Ear: Tympanic membrane and ear canal normal.     Left Ear: Tympanic membrane and ear canal normal.  Mouth/Throat:     Mouth: Mucous membranes are moist.     Pharynx: Oropharynx is clear. No oropharyngeal exudate or posterior oropharyngeal erythema.  Eyes:     Conjunctiva/sclera: Conjunctivae normal.  Cardiovascular:     Rate and Rhythm: Normal rate and regular rhythm.     Pulses: Normal pulses.  Pulmonary:     Effort: Pulmonary effort is normal. No respiratory distress.     Breath sounds: Normal breath sounds. No wheezing.  Abdominal:     Palpations: Abdomen is soft.     Tenderness: There is no abdominal tenderness.  Musculoskeletal:        General: Normal range of motion.     Cervical back:  Normal range of motion. No rigidity or tenderness.  Lymphadenopathy:     Cervical: No cervical adenopathy.  Skin:    General: Skin is warm.     Capillary Refill: Capillary refill takes less than 2 seconds.     Findings: No rash.  Neurological:     General: No focal deficit present.     Mental Status: She is alert.     Sensory: No sensory deficit.     Motor: No weakness.     ED Results / Procedures / Treatments   Labs (all labs ordered are listed, but only abnormal results are displayed) Labs Reviewed  RESP PANEL BY RT-PCR (RSV, FLU A&B, COVID)  [id number] - Abnormal; Notable for the following components:      Result Value   SARS Coronavirus 2 by RT PCR POSITIVE (*)    All other components within normal limits    EKG None  Radiology No results found.  Procedures Procedures    Medications Ordered in ED Medications - No data to display  ED Course/ Medical Decision Making/ A&P                             Medical Decision Making Patient here with complaints of flulike symptoms x 1 day.  Reports gradual onset frontal headache throbbing in quality.  Headache began after body aches.  Reports max fever of 102 at home some relief of temp after taking Tylenol.  Cough without chest pain or shortness of breath.  On exam, patient well-appearing nontoxic.  She is afebrile here.  Vital signs are reassuring.  No tachycardia tachypnea or hypoxia.  No increased work of breathing on exam.  Clinically, suspect this is related to viral illness ACS, PE, pneumonia also considered but felt less likely  Amount and/or Complexity of Data Reviewed Labs: ordered.    Details: Respiratory panel positive for COVID Discussion of management or test interpretation with external provider(s): Discussed findings with patient.  She has been fully vaccinated and has had COVID booster. Patient requesting treatment with Paxlovid.  Consulted pharmacy about this as patient is on apixaban for her atrial  fibrillation.  Consulted pharmacist at [facility],  who recommends that patient not be given Paxlovid in the setting of anticoagulant. Will proceed with symptomatic treatment, she appears appropriate for discharge home, close outpatient follow-up recommended return precautions discussed  Risk Prescription drug management.           Final Clinical Impression(s) / ED Diagnoses Final diagnoses:  COVID    Rx / DC Orders ED Discharge Orders     None         Kem Parkinson, PA-C 03/10/22 1425    Sherwood Gambler, MD 03/11/22 2313

## 2022-03-09 ENCOUNTER — Telehealth: Payer: Self-pay

## 2022-03-09 NOTE — Telephone Encounter (Signed)
Transition Care Management Unsuccessful Follow-up Telephone Call  Date of discharge and from where:  [facility] 03/08/2022  Attempts:  1st Attempt  Reason for unsuccessful TCM follow-up call:  Left voice message Juanda Crumble, [facility] Direct Dial [phone]

## 2022-03-10 NOTE — Telephone Encounter (Signed)
Transition Care Management Follow-up Telephone Call Date of discharge and from where: [facility] 03/08/2022 How have you been since you were released from the hospital? better Any questions or concerns? No  Items Reviewed: Did the pt receive and understand the discharge instructions provided? Yes  Medications obtained and verified? Yes  Other? No  Any new allergies since your discharge? No  Dietary orders reviewed? Yes Do you have support at home? Yes   Home Care and Equipment/Supplies: Were home health services ordered? no If so, what is the name of the agency? N/a  Has the agency set up a time to come to the patient's home? not applicable Were any new equipment or medical supplies ordered?  No What is the name of the medical supply agency? N/a Were you able to get the supplies/equipment? no Do you have any questions related to the use of the equipment or supplies? No  Functional Questionnaire: (I = Independent and D = Dependent) ADLs: I  Bathing/Dressing- I  Meal Prep- I  Eating- I  Maintaining continence- I  Transferring/Ambulation- I  Managing Meds- I  Follow up appointments reviewed:  PCP Hospital f/u appt confirmed? NO, NOT [facility] f/u appt confirmed? No   Are transportation arrangements needed? No  If their condition worsens, is the pt aware to call PCP or go to the Emergency Dept.? Yes Was the patient provided with contact information for the PCP's office or ED? Yes Was to pt encouraged to call back with questions or concerns? Yes Juanda Crumble, LPN [place] Direct Dial [phone]

## 2022-03-22 ENCOUNTER — Encounter (HOSPITAL_COMMUNITY)
Admission: RE | Admit: 2022-03-22 | Discharge: 2022-03-22 | Disposition: A | Discharge status: Home / Self Care | Payer: Medicare Other | Source: Ambulatory Visit | Attending: Nurse Practitioner | Admitting: Nurse Practitioner

## 2022-03-22 ENCOUNTER — Ambulatory Visit (HOSPITAL_COMMUNITY)
Admission: RE | Admit: 2022-03-22 | Discharge: 2022-03-22 | Disposition: A | Discharge status: Home / Self Care | Payer: Medicare Other | Source: Ambulatory Visit | Attending: Nurse Practitioner | Admitting: Nurse Practitioner

## 2022-03-22 DIAGNOSIS — E0500 Thyrotoxicosis with diffuse goiter without thyrotoxic crisis or storm: Secondary | ICD-10-CM | POA: Insufficient documentation

## 2022-03-22 DIAGNOSIS — E890 Postprocedural hypothyroidism: Secondary | ICD-10-CM | POA: Diagnosis present

## 2022-03-22 MED ORDER — SODIUM IODIDE I 131 CAPSULE
19.0000 | Freq: Once | INTRAVENOUS | Status: AC | PRN
  Administered 2022-03-22: 19.5 via ORAL

## 2022-03-23 ENCOUNTER — Other Ambulatory Visit: Payer: Self-pay | Admitting: Nurse Practitioner

## 2022-03-23 ENCOUNTER — Ambulatory Visit (HOSPITAL_COMMUNITY)
Admission: RE | Admit: 2022-03-23 | Discharge: 2022-03-23 | Disposition: A | Discharge status: Home / Self Care | Payer: Medicare Other | Source: Ambulatory Visit | Attending: Nurse Practitioner | Admitting: Nurse Practitioner

## 2022-03-23 DIAGNOSIS — E0590 Thyrotoxicosis, unspecified without thyrotoxic crisis or storm: Secondary | ICD-10-CM

## 2022-03-23 DIAGNOSIS — E0500 Thyrotoxicosis with diffuse goiter without thyrotoxic crisis or storm: Secondary | ICD-10-CM

## 2022-03-23 MED ORDER — SODIUM PERTECHNETATE TC 99M INJECTION
4.5000 | Freq: Once | INTRAVENOUS | Status: AC | PRN
  Administered 2022-03-23: 4.5 via INTRAVENOUS

## 2022-03-23 NOTE — Progress Notes (Signed)
Her results of her uptake and scan came back indicating her thyroid is STILL overactive and did not respond appropriately to previous RAI ablation.  She will need this ablation repeated.  I will go ahead and put in the order.  Will need to repeat thyroid labs about 6 weeks after procedure to assess response to therapy and judge timing of initiation of thyroid hormone replacement therapy (I put in those orders as well).

## 2022-03-24 ENCOUNTER — Telehealth: Payer: Self-pay | Admitting: *Deleted

## 2022-03-24 NOTE — Telephone Encounter (Signed)
Krista Ramirez called back to speak with you. Please call Ramirez

## 2022-03-24 NOTE — Telephone Encounter (Signed)
Patient was called and a message was left, asking her to call our office for results.

## 2022-03-24 NOTE — Telephone Encounter (Signed)
Called the patient another message was left for her to call our office back.

## 2022-03-24 NOTE — Telephone Encounter (Signed)
-----   Message from Whitney J Reardon, NP sent at 03/23/2022 11:32 AM EST ----- Her results of her uptake and scan came back indicating her thyroid is STILL overactive and did not respond appropriately to previous RAI ablation.  She will need this ablation repeated.  I will go ahead and put in the order.  Will need to repeat thy roid labs about 6 weeks after procedure to assess response to therapy and judge timing of initiation of thyroid hormone replacement therapy (I put in those orders as well). 

## 2022-03-24 NOTE — Telephone Encounter (Signed)
I have called the patient a few times today and she has responded  but at the time I was unable to talk with her. I called her and left a detailed message with her results and plan per Whitney.

## 2022-03-24 NOTE — Telephone Encounter (Signed)
-----   Message from Brita Romp, NP sent at 03/23/2022 11:32 AM EST ----- Her results of her uptake and scan came back indicating her thyroid is STILL overactive and did not respond appropriately to previous RAI ablation.  She will need this ablation repeated.  I will go ahead and put in the order.  Will need to repeat thy roid labs about 6 weeks after procedure to assess response to therapy and judge timing of initiation of thyroid hormone replacement therapy (I put in those orders as well).

## 2022-04-01 NOTE — Written Directive (Cosign Needed)
MOLECULAR IMAGING AND THERAPEUTICS WRITTEN DIRECTIVE   PATIENT NAME: Krista Ramirez  PT DOB:   [birth year]                                              MRN: [id number]  ---------------------------------------------------------------------------------------------------------------------   I-131 WHOLE THYROID THERAPY (NON-CANCER)    RADIOPHARMACEUTICAL:   Iodine-131 Capsule    PRESCRIBED DOSE FOR ADMINISTRATION:    ROUTE OFADMINISTRATION: PO   DIAGNOSIS:  Recurrent hyperthyroidism/Grave's disease   REFERRING PHYSICIAN:  Dr. Stan Head, NP   TSH:    Lab Results  Component Value Date   TSH <0.005 (L) 03/03/2022   TSH <0.005 (L) 12/30/2021   TSH <0.005 (L) 10/27/2021     PRIOR I-131 THERAPY (Date and Dose):  05/07/2021 20 mCi   PRIOR RADIOLOGY EXAMS (Results and Date): NM THYROID MULT UPTAKE W/IMAGING  Result Date: 03/23/2022 CLINICAL DATA:  Hyperthyroidism, prior radioactive iodine ablation March 2023, labs still indicating hyperactivity, suppressed TSH, increased tiredness EXAM: THYROID SCAN AND UPTAKE - 4 AND 24 HOURS TECHNIQUE: Following the per oral administration of I-131 sodium iodide, the patient returned at 4 and 24 hours and uptake measurements were acquired with the uptake probe centered on the neck. Thyroid imaging was performed following the intravenous administration of Tc-94mPertechnetate. RADIOPHARMACEUTICALS:  4.5 mCi Technetium-9100mertechnetate IV and 19.5 microCuries I-131 sodium iodide orally COMPARISON:  04/28/2021 FINDINGS: Homogeneous tracer distribution in both thyroid lobes. Questionable tiny cold nodule at inferior pole RIGHT lobe versus artifact, not significant. 4 hour I-131 uptake = 20.9% (normal 5-20%) 24 hour I-131 uptake = 40.2% (normal 10-30%) IMPRESSION: Recurrent hyperthyroidism/Graves disease with elevated 4 hour and 24 hour radio iodine uptakes. Electronically Signed   By: MaLavonia Dana.D.   On: 03/23/2022 10:58   NM THYROID  MULT UPTAKE W/IMAGING  Result Date: 04/29/2021 CLINICAL DATA:  Hyperthyroidism. TSH equal 0.005. Nervousness. And increased appetite. Difficulty sleeping. Hair loss. Heart palpitations. EXAM: THYROID SCAN AND UPTAKE - 4 AND 24 HOURS TECHNIQUE: Following oral administration of I-123 capsule, anterior planar imaging was acquired at 24 hours. Thyroid uptake was calculated with a thyroid probe at 4-6 hours and 24 hours. RADIOPHARMACEUTICALS:  Three hundred uCi I-123 sodium iodide p.o. COMPARISON:  None. FINDINGS: Uniform uptake within thyroid gland.  No nodularity. 4 hour I-123 uptake = 71.8% (normal 5-20%) 24 hour I-123 uptake = 59.4% (normal 10-30%) IMPRESSION: 1. Thyroid imaging and I 123 uptake consistent with Graves disease. 2. Rapid turnover of I 123 at 4 hour uptake. Electronically Signed   By: StSuzy Bouchard.D.   On: 04/29/2021 16:05      ADDITIONAL PHYSICIAN COMMENTS/NOTES   AUTHORIZED USER SIGNATURE & TIME STAMP:

## 2022-04-06 NOTE — Progress Notes (Signed)
[facility] referral received. I have attempted to reach the patient regarding referral but was unable to reach the patient directly. Detailed VM left asking that the patient return my call.

## 2022-04-08 ENCOUNTER — Encounter (HOSPITAL_COMMUNITY)
Admission: RE | Admit: 2022-04-08 | Discharge: 2022-04-08 | Disposition: A | Discharge status: Home / Self Care | Payer: Medicare Other | Source: Ambulatory Visit | Attending: Nurse Practitioner | Admitting: Nurse Practitioner

## 2022-04-08 DIAGNOSIS — E0590 Thyrotoxicosis, unspecified without thyrotoxic crisis or storm: Secondary | ICD-10-CM | POA: Insufficient documentation

## 2022-04-08 DIAGNOSIS — E0500 Thyrotoxicosis with diffuse goiter without thyrotoxic crisis or storm: Secondary | ICD-10-CM | POA: Insufficient documentation

## 2022-04-08 MED ORDER — SODIUM IODIDE I 131 CAPSULE
29.6000 | Freq: Once | INTRAVENOUS | Status: AC | PRN
  Administered 2022-04-08: 29.6 via ORAL

## 2022-04-11 ENCOUNTER — Ambulatory Visit: Payer: Medicare Other | Admitting: Internal Medicine

## 2022-04-26 ENCOUNTER — Encounter: Payer: Self-pay | Admitting: Internal Medicine

## 2022-04-26 ENCOUNTER — Ambulatory Visit (INDEPENDENT_AMBULATORY_CARE_PROVIDER_SITE_OTHER): Payer: Medicare Other | Admitting: Internal Medicine

## 2022-04-26 VITALS — BP 116/81 | HR 70 | Ht 62.5 in | Wt 162.2 lb

## 2022-04-26 DIAGNOSIS — I4891 Unspecified atrial fibrillation: Secondary | ICD-10-CM

## 2022-04-26 DIAGNOSIS — Z7901 Long term (current) use of anticoagulants: Secondary | ICD-10-CM

## 2022-04-26 DIAGNOSIS — L918 Other hypertrophic disorders of the skin: Secondary | ICD-10-CM | POA: Diagnosis not present

## 2022-04-26 DIAGNOSIS — E0590 Thyrotoxicosis, unspecified without thyrotoxic crisis or storm: Secondary | ICD-10-CM | POA: Diagnosis not present

## 2022-04-26 DIAGNOSIS — I480 Paroxysmal atrial fibrillation: Secondary | ICD-10-CM | POA: Diagnosis not present

## 2022-04-26 DIAGNOSIS — F411 Generalized anxiety disorder: Secondary | ICD-10-CM

## 2022-04-26 DIAGNOSIS — D649 Anemia, unspecified: Secondary | ICD-10-CM

## 2022-04-26 DIAGNOSIS — I700 Atherosclerosis of aorta: Secondary | ICD-10-CM

## 2022-04-26 MED ORDER — CITALOPRAM HYDROBROMIDE 10 MG PO TABS: 10.0000 mg | ORAL_TABLET | Freq: Every day | ORAL | 1 refills | Status: DC

## 2022-04-26 MED ORDER — APIXABAN 5 MG PO TABS: 5.0000 mg | ORAL_TABLET | Freq: Two times a day (BID) | ORAL | 5 refills | Status: DC

## 2022-04-26 MED ORDER — DILTIAZEM HCL ER COATED BEADS 120 MG PO CP24: 120.0000 mg | ORAL_CAPSULE | Freq: Every day | ORAL | 5 refills | Status: DC

## 2022-04-26 NOTE — Patient Instructions (Signed)
Please continue taking medications as prescribed.  Please continue to follow low salt diet and ambulate as tolerated. 

## 2022-04-26 NOTE — Progress Notes (Unsigned)
Established Patient Office Visit  Subjective:  Patient ID: Krista Ramirez, female    DOB: 1955-12-03  Age: 67 y.o. MRN: [id number]  CC:  Chief Complaint  Patient presents with   Hypothyroidism    Four month follow up for hypothyroidism. Patient has skin tag on her eye she would like removed.    HPI Krista Ramirez is a 67 y.o. female with past medical history of hyperthyroidism, atrial fibrillation and GAD who presents for f/u of her chronic medical conditions.  She has had radioactive ablation X 2 of thyroid for hyperthyroidism. Her thyroid function test showed very low TSH recently. She had another radioactive ablation in the last month. She denies tremors or palpitations currently.  She reports fatigue and weight gain since last radioactive ablation.  She was started on Cardizem and Eliquis for episode of A-fib with RVR during her hospital stay, which was likely provoked by uncontrolled hyperthyroidism.  She has seen cardiologist since then.  She currently denies any chest pain, dyspnea or palpitations.  Her BP has been well controlled at home.  She has a skin tag on right eyelid, which is affecting her vision at times.  She requests dermatology referral for removal of skin tag.  Past Medical History:  Diagnosis Date   Anemia 04/27/2022   Anxiety 03/17/2021   COPD (chronic obstructive pulmonary disease) (HCC)    COPD (chronic obstructive pulmonary disease) (HCC)    Hyperthyroidism     Past Surgical History:  Procedure Laterality Date   SKIN GRAFT     TUBAL LIGATION      Family History  Problem Relation Age of Onset   Hypertension Mother     Social History   Socioeconomic History   Marital status: Married    Spouse name: Not on file   Number of children: Not on file   Years of education: Not on file   Highest education level: Not on file  Occupational History   Not on file  Tobacco Use   Smoking status: Former    Packs/day: 1.00    Years: 30.00    Total pack  years: 30.00    Types: Cigarettes    Quit date: 02/22/2021    Years since quitting: 1.1   Smokeless tobacco: Never  Vaping Use   Vaping Use: Never used  Substance and Sexual Activity   Alcohol use: Never   Drug use: Never   Sexual activity: Yes    Birth control/protection: None  Other Topics Concern   Not on file  Social History Narrative   Not on file   Social Determinants of Health   Financial Resource Strain: Low Risk  (09/02/2021)   Overall Financial Resource Strain (CARDIA)    Difficulty of Paying Living Expenses: Not hard at all  Food Insecurity: No Food Insecurity (09/02/2021)   Hunger Vital Sign    Worried About Running Out of Food in the Last Year: Never true    Ran Out of Food in the Last Year: Never true  Transportation Needs: No Transportation Needs (09/02/2021)   PRAPARE - Hydrologist (Medical): No    Lack of Transportation (Non-Medical): No  Physical Activity: Sufficiently Active (09/02/2021)   Exercise Vital Sign    Days of Exercise per Week: 5 days    Minutes of Exercise per Session: 150+ min  Stress: No Stress Concern Present (09/02/2021)   [place]    Feeling of Stress : Not  at all  Social Connections: Moderately Isolated (09/02/2021)   Social Connection and Isolation Panel [NHANES]    Frequency of Communication with Friends and Family: Three times a week    Frequency of Social Gatherings with Friends and Family: Once a week    Attends Religious Services: Never    Marine scientist or Organizations: No    Attends Archivist Meetings: Never    Marital Status: Married  Human resources officer Violence: Not At Risk (09/02/2021)   Humiliation, Afraid, Rape, and Kick questionnaire    Fear of Current or Ex-Partner: No    Emotionally Abused: No    Physically Abused: No    Sexually Abused: No    Outpatient Medications Prior to Visit  Medication Sig Dispense  Refill   albuterol (VENTOLIN HFA) 108 (90 Base) MCG/ACT inhaler Inhale 2 puffs into the lungs every 6 (six) hours as needed for wheezing or shortness of breath. 8 g 5   benzonatate (TESSALON) 200 MG capsule Take 1 capsule (200 mg total) by mouth 3 (three) times daily as needed for cough. Swallow whole, do not chew (Patient not taking: Reported on 03/10/2022) 21 capsule 0   Magnesium 250 MG TABS Take 250 mg by mouth daily.     apixaban (ELIQUIS) 5 MG TABS tablet Take 1 tablet (5 mg total) by mouth 2 (two) times daily. 60 tablet 5   citalopram (CELEXA) 10 MG tablet Take 1 tablet (10 mg total) by mouth daily. 90 tablet 1   diltiazem (CARDIZEM CD) 120 MG 24 hr capsule Take 1 capsule (120 mg total) by mouth daily. 30 capsule 5   No facility-administered medications prior to visit.    No Known Allergies  ROS Review of Systems  Constitutional:  Positive for fatigue. Negative for chills and fever.  HENT:  Negative for congestion, postnasal drip, sinus pressure and sinus pain.   Eyes:  Negative for pain and discharge.  Respiratory:  Negative for cough and shortness of breath.   Cardiovascular:  Negative for chest pain and palpitations.  Gastrointestinal:  Negative for abdominal pain, diarrhea, nausea and vomiting.  Endocrine: Negative for polydipsia and polyuria.  Genitourinary:  Negative for dysuria and hematuria.  Musculoskeletal:  Negative for neck pain and neck stiffness.  Skin:  Negative for rash.  Neurological:  Negative for dizziness and weakness.  Psychiatric/Behavioral:  Negative for agitation and behavioral problems. The patient is nervous/anxious.       Objective:    Physical Exam Vitals reviewed.  Constitutional:      General: She is not in acute distress.    Appearance: She is not diaphoretic.  HENT:     Head: Normocephalic and atraumatic.     Nose: No congestion.     Mouth/Throat:     Mouth: Mucous membranes are moist.  Eyes:     General: No scleral icterus.     Extraocular Movements: Extraocular movements intact.  Cardiovascular:     Rate and Rhythm: Normal rate and regular rhythm.     Pulses: Normal pulses.     Heart sounds: Normal heart sounds. No murmur heard. Pulmonary:     Breath sounds: Normal breath sounds. No wheezing or rales.  Musculoskeletal:     Cervical back: Neck supple. No tenderness.     Right lower leg: No edema.     Left lower leg: No edema.  Skin:    General: Skin is warm.     Findings: No rash.     Comments: Skin tag  over right eyelid, near medial epicanthus  Neurological:     General: No focal deficit present.     Mental Status: She is alert and oriented to person, place, and time.     Sensory: No sensory deficit.     Motor: No weakness.  Psychiatric:        Mood and Affect: Mood normal.        Behavior: Behavior normal.     BP 116/81 (BP Location: Left Arm, Patient Position: Sitting, Cuff Size: Normal)   Pulse 70   Ht 5' 2.5" (1.588 m)   Wt 162 lb 3.2 oz (73.6 kg)   SpO2 95%   BMI 29.19 kg/m  Wt Readings from Last 3 Encounters:  04/26/22 162 lb 3.2 oz (73.6 kg)  03/08/22 157 lb (71.2 kg)  03/07/22 157 lb 12.8 oz (71.6 kg)    Lab Results  Component Value Date   TSH <0.005 (L) 03/03/2022   Lab Results  Component Value Date   WBC 4.9 12/09/2021   HGB 10.7 (L) 12/09/2021   HCT 34.2 12/09/2021   MCV 86 12/09/2021   PLT 334 12/09/2021   Lab Results  Component Value Date   NA 143 12/09/2021   K 4.7 12/09/2021   CO2 22 12/09/2021   GLUCOSE 100 (H) 12/09/2021   BUN 12 12/09/2021   CREATININE 0.80 12/09/2021   BILITOT 0.2 12/09/2021   ALKPHOS 233 (H) 12/09/2021   AST 17 12/09/2021   ALT 20 12/09/2021   PROT 7.0 12/09/2021   ALBUMIN 4.2 12/09/2021   CALCIUM 9.5 12/09/2021   ANIONGAP 9 03/30/2021   EGFR 81 12/09/2021   No results found for: "CHOL" No results found for: "HDL" No results found for: "LDLCALC" No results found for: "TRIG" No results found for: "CHOLHDL" No results found for:  "HGBA1C"    Assessment & Plan:   Problem List Items Addressed This Visit    Atrial fibrillation ([place]) Had A-fib with RVR during recent hospital visit, likely due to uncontrolled hyperthyroidism Currently in sinus rhythm Currently on Diltiazem and Eliquis -advised to continue for now, refills provided Followed by cardiology  Hyperthyroidism Was on methimazole Had stopped taking it in the past, which led to uncontrolled hyperthyroidism complicated with A-fib with RVR Referred to endocrinology - s/p radioactive ablation X 2 Now has fatigue and weight gain, check TSH and free T4 after 6 weeks of radioactive ablation  GAD (generalized anxiety disorder) Well-controlled with Celexa  Aortic atherosclerosis ([place]) Noted on CXR Will check lipid profile later and decide about statin  Skin tag Right eyelid skin tag, affecting vision Refer to dermatology for removal of skin tag  Anemia Denies any active signs of bleeding currently On Eliquis for history of A-fib Check CBC and iron profile    Meds ordered this encounter  Medications   diltiazem (CARDIZEM CD) 120 MG 24 hr capsule    Sig: Take 1 capsule (120 mg total) by mouth daily.    Dispense:  30 capsule    Refill:  5   apixaban (ELIQUIS) 5 MG TABS tablet    Sig: Take 1 tablet (5 mg total) by mouth 2 (two) times daily.    Dispense:  60 tablet    Refill:  5   citalopram (CELEXA) 10 MG tablet    Sig: Take 1 tablet (10 mg total) by mouth daily.    Dispense:  90 tablet    Refill:  1    Follow-up: Return in about 4 months (around 08/26/2022)  for Hyperthyroidism, GAD and anemia.    Lindell Spar, MD

## 2022-04-26 NOTE — Assessment & Plan Note (Signed)
Well-controlled with Celexa 

## 2022-04-26 NOTE — Assessment & Plan Note (Signed)
Had A-fib with RVR during recent hospital visit, likely due to uncontrolled hyperthyroidism Currently in sinus rhythm Currently on Diltiazem and Eliquis -advised to continue for now, refills provided Followed by cardiology

## 2022-04-26 NOTE — Assessment & Plan Note (Signed)
Was on methimazole Had stopped taking it in the past, which led to uncontrolled hyperthyroidism complicated with A-fib with RVR Referred to endocrinology - s/p radioactive ablation X 2 Now has fatigue and weight gain, check TSH and free T4 after 6 weeks of radioactive ablation

## 2022-04-27 ENCOUNTER — Encounter: Payer: Self-pay | Admitting: Internal Medicine

## 2022-04-27 DIAGNOSIS — D649 Anemia, unspecified: Secondary | ICD-10-CM

## 2022-04-27 DIAGNOSIS — L918 Other hypertrophic disorders of the skin: Secondary | ICD-10-CM | POA: Insufficient documentation

## 2022-04-27 HISTORY — DX: Anemia, unspecified: D64.9

## 2022-04-27 NOTE — Assessment & Plan Note (Signed)
Right eyelid skin tag, affecting vision Refer to dermatology for removal of skin tag

## 2022-04-27 NOTE — Assessment & Plan Note (Signed)
Denies any active signs of bleeding currently On Eliquis for history of A-fib Check CBC and iron profile

## 2022-04-27 NOTE — Assessment & Plan Note (Signed)
Noted on CXR Will check lipid profile later and decide about statin

## 2022-06-06 NOTE — Progress Notes (Signed)
[facility] referral received. Call placed to patient who tells me that she is not interested in [facility] at this time but will ask to be re-referred to the program when she is interested.

## 2022-06-24 ENCOUNTER — Ambulatory Visit: Payer: Medicare Other | Attending: Cardiology | Admitting: Cardiology

## 2022-06-24 ENCOUNTER — Encounter: Payer: Self-pay | Admitting: Cardiology

## 2022-06-24 VITALS — BP 134/86 | HR 60 | Ht 62.5 in | Wt 173.0 lb

## 2022-06-24 DIAGNOSIS — I480 Paroxysmal atrial fibrillation: Secondary | ICD-10-CM | POA: Insufficient documentation

## 2022-06-24 NOTE — Patient Instructions (Signed)
Medication Instructions:  Your physician recommends that you continue on your current medications as directed. Please refer to the Current Medication list given to you today.   Labwork: None today  Testing/Procedures: None today  Follow-Up: 6 months  Any Other Special Instructions Will Be Listed Below (If Applicable).  If you need a refill on your cardiac medications before your next appointment, please call your pharmacy.  

## 2022-06-24 NOTE — Progress Notes (Signed)
Clinical Summary Ms. Craker is a 67 y.o.female seen today for follow up of the following medical problems.   PAF -new diagnosis 03/2021 admission in setting of severe hyperthyroidism -  converted with IV cardizem   - no recent palpitations - compliant with meds. No bleeding on eliquis    2.Hyperthyoidism - followed by endocrinology.  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 8 g 5   apixaban (ELIQUIS) 5 MG TABS tablet Take 1 tablet (5 mg total) by mouth 2 (two) times daily. 60 tablet 5   benzonatate (TESSALON) 200 MG capsule Take 1 capsule (200 mg total) by mouth 3 (three) times daily as needed for cough. Swallow whole, do not chew (Patient not taking: Reported on 03/10/2022) 21 capsule 0   citalopram (CELEXA) 10 MG tablet Take 1 tablet (10 mg total) by mouth daily. 90 tablet 1   diltiazem (CARDIZEM CD) 120 MG 24 hr capsule Take 1 capsule (120 mg total) by mouth daily. 30 capsule 5   Magnesium 250 MG TABS Take 250 mg by mouth daily.     No current facility-administered medications for this visit.     Past Surgical History:  Procedure Laterality Date   SKIN GRAFT     TUBAL LIGATION       No Known Allergies    Family History  Problem Relation Age of Onset   Hypertension Mother      Social History Ms. Steinbrunner reports that she quit smoking about 16 months ago. Her smoking use included cigarettes. She has a 30.00 pack-year smoking history. She has  never used smokeless tobacco. Ms. Mcgurl reports no history of alcohol use.   Review of Systems CONSTITUTIONAL: No weight loss, fever, chills, weakness or fatigue.  HEENT: Eyes: No visual loss, blurred vision, double vision or yellow sclerae.No hearing loss, sneezing, congestion, runny nose or sore throat.  SKIN: No rash or itching.  CARDIOVASCULAR: per hpi RESPIRATORY: No shortness of breath, cough or sputum.  GASTROINTESTINAL: No anorexia, nausea, vomiting or diarrhea. No abdominal pain or blood.  GENITOURINARY: No burning on urination, no polyuria NEUROLOGICAL: No headache, dizziness, syncope, paralysis, ataxia, numbness or tingling in the extremities. No change in bowel or bladder control.  MUSCULOSKELETAL: No muscle, back pain, joint pain or stiffness.  LYMPHATICS: No enlarged nodes. No history of splenectomy.  PSYCHIATRIC: No history of depression or anxiety.  ENDOCRINOLOGIC: No reports of sweating, cold or heat intolerance. No polyuria or polydipsia.  Marland Kitchen   Physical Examination Today's Vitals   06/24/22 0834  BP: 134/86  Pulse: 60  SpO2: 96%  Weight: 173 lb (78.5 kg)  Height: 5' 2.5" (1.588 m)   Body mass index is 31.14 kg/m.  Gen: resting comfortably, no acute distress HEENT: no scleral icterus, pupils equal round and reactive, no palptable cervical adenopathy,  CV: RRR, no m/rg, no jvd Resp: Clear to auscultation bilaterally GI: abdomen is soft, non-tender, non-distended, normal bowel sounds, no hepatosplenomegaly  MSK: extremities are warm, no edema.  Skin: warm, no rash Neuro:  no focal deficits Psych: appropriate affect    Assessment and Plan  1.PAF/acquired thrombophilia - no significant symptoms - continue current meds including eliquis for stroke prevention - when euthyroid would plan for 30 day monitor to evaluate for any recurrent afib, may have been solely related to severe hyperthyroidism at the time        Antoine Poche, M.D.

## 2022-06-29 LAB — TSH: TSH: 42.6 u[IU]/mL — ABNORMAL HIGH (ref 0.450–4.500)

## 2022-06-29 LAB — T4, FREE: Free T4: 0.1 ng/dL — ABNORMAL LOW (ref 0.82–1.77)

## 2022-06-29 LAB — T3, FREE: T3, Free: <0.3 pg/mL — ABNORMAL LOW (ref 2.0–4.4)

## 2022-07-04 NOTE — Patient Instructions (Signed)

## 2022-07-05 ENCOUNTER — Encounter: Payer: Self-pay | Admitting: Nurse Practitioner

## 2022-07-05 ENCOUNTER — Ambulatory Visit (INDEPENDENT_AMBULATORY_CARE_PROVIDER_SITE_OTHER): Payer: Medicare Other | Admitting: Nurse Practitioner

## 2022-07-05 VITALS — BP 136/88 | HR 71 | Ht 62.5 in | Wt 175.0 lb

## 2022-07-05 DIAGNOSIS — E890 Postprocedural hypothyroidism: Secondary | ICD-10-CM | POA: Diagnosis not present

## 2022-07-05 MED ORDER — LEVOTHYROXINE SODIUM 75 MCG PO TABS: 75.0000 ug | ORAL_TABLET | Freq: Every day | ORAL | 1 refills | Status: DC

## 2022-07-05 NOTE — Progress Notes (Signed)
07/05/2022     Endocrinology Follow Up Note     Subjective:    Patient ID: Krista Ramirez, female    DOB: March 11, 1955, PCP Anabel Halon, MD.   Past Medical History:  Diagnosis Date   Anemia 04/27/2022   Anxiety 03/17/2021   COPD (chronic obstructive pulmonary disease) (HCC)    COPD (chronic obstructive pulmonary disease) (HCC)    Hyperthyroidism     Past Surgical History:  Procedure Laterality Date   SKIN GRAFT     TUBAL LIGATION      Social History   Socioeconomic History   Marital status: Married    Spouse name: Not on file   Number of children: Not on file   Years of education: Not on file   Highest education level: Not on file  Occupational History   Not on file  Tobacco Use   Smoking status: Former    Packs/day: 1.00    Years: 30.00    Additional pack years: 0.00    Total pack years: 30.00    Types: Cigarettes    Quit date: 02/22/2021    Years since quitting: 1.3   Smokeless tobacco: Never  Vaping Use   Vaping Use: Never used  Substance and Sexual Activity   Alcohol use: Never   Drug use: Never   Sexual activity: Yes    Birth control/protection: None  Other Topics Concern   Not on file  Social History Narrative   Not on file   Social Determinants of Health   Financial Resource Strain: Low Risk  (09/02/2021)   Overall Financial Resource Strain (CARDIA)    Difficulty of Paying Living Expenses: Not hard at all  Food Insecurity: No Food Insecurity (09/02/2021)   Hunger Vital Sign    Worried About Running Out of Food in the Last Year: Never true    Ran Out of Food in the Last Year: Never true  Transportation Needs: No Transportation Needs (09/02/2021)   PRAPARE - Administrator, Civil Service (Medical): No    Lack of Transportation (Non-Medical): No  Physical Activity: Sufficiently Active (09/02/2021)   Exercise Vital Sign    Days of Exercise per Week: 5 days    Minutes of Exercise per Session: 150+ min  Stress: No Stress  Concern Present (09/02/2021)   [employer] of Occupational Health - Occupational Stress Questionnaire    Feeling of Stress : Not at all  Social Connections: Moderately Isolated (09/02/2021)   Social Connection and Isolation Panel [NHANES]    Frequency of Communication with Friends and Family: Three times a week    Frequency of Social Gatherings with Friends and Family: Once a week    Attends Religious Services: Never    Database administrator or Organizations: No    Attends Banker Meetings: Never    Marital Status: Married    Family History  Problem Relation Age of Onset   Hypertension Mother     Outpatient Encounter Medications as of 07/05/2022  Medication Sig   albuterol (VENTOLIN HFA) 108 (90 Base) MCG/ACT inhaler Inhale 2 puffs into the lungs every 6 (six) hours as needed for wheezing or shortness of breath.   apixaban (ELIQUIS) 5 MG TABS tablet Take 1 tablet (5 mg total) by mouth 2 (two) times daily.   citalopram (CELEXA) 10 MG tablet Take 1 tablet (10 mg total) by mouth daily.   diltiazem (CARDIZEM CD) 120 MG 24 hr capsule Take 1 capsule (120 mg  total) by mouth daily.   levothyroxine (SYNTHROID) 75 MCG tablet Take 1 tablet (75 mcg total) by mouth daily before breakfast.   Magnesium 250 MG TABS Take 250 mg by mouth daily.   benzonatate (TESSALON) 200 MG capsule Take 1 capsule (200 mg total) by mouth 3 (three) times daily as needed for cough. Swallow whole, do not chew (Patient not taking: Reported on 03/10/2022)   No facility-administered encounter medications on file as of 07/05/2022.    ALLERGIES: No Known Allergies  VACCINATION STATUS: Immunization History  Administered Date(s) Administered   Fluad Quad(high Dose 65+) 12/06/2021   Influenza-Unspecified 12/03/2020   PNEUMOCOCCAL CONJUGATE-20 12/20/2021   Pfizer Covid-19 Vaccine Bivalent Booster 41yrs & up 02/17/2019, 03/10/2019, 11/04/2019   Zoster Recombinat (Shingrix) 12/20/2021, 02/25/2022      HPI  Krista Ramirez is 67 y.o. female who presents today with a medical history as above. she is being seen in follow up after being seen in consultation for hyperthyroidism requested by Anabel Halon, MD.  she has been dealing with symptoms of anxiety, insomnia, irritability, palpitations, weight loss, and tremors for 6-8 months. These symptoms are progressively worsening and troubling to her.  her most recent thyroid labs revealed suppressed TSH of < 0.010, high Free T3 of 26.5 and high Free T4 of 4.79 on 03/29/21. she denies dysphagia, choking, shortness of breath, no recent voice change.    she does have family history of thyroid dysfunction in her siblings, but denies family hx of thyroid cancer. she denies personal history of goiter. she is currently on Methimazole 10 mg po twice daily. Denies use of Biotin containing supplements.  she is willing to proceed with appropriate work up and therapy for thyrotoxicosis.   Review of systems  Constitutional: + rapidly increasing body weight,  current Body mass index is 31.5 kg/m. , + fatigue, no subjective hyperthermia, + subjective hypothermia Eyes: no blurry vision, no xerophthalmia ENT: no sore throat, no nodules palpated in throat, no dysphagia/odynophagia, no hoarseness Cardiovascular: no chest pain, no shortness of breath, no palpitations, no leg swelling Respiratory: no cough, no shortness of breath Gastrointestinal: no nausea/vomiting/diarrhea, + constipation and indigestion Musculoskeletal: + diffuse muscle/joint aches Skin: no rashes, no hyperemia Neurological: no tremors, + numbness/tingling/pens and needles sensation to mid upper back, no dizziness Psychiatric: no depression, no anxiety   Objective:    BP 136/88 (BP Location: Left Arm, Patient Position: Sitting, Cuff Size: Large)   Pulse 71   Ht 5' 2.5" (1.588 m)   Wt 175 lb (79.4 kg)   BMI 31.50 kg/m   Wt Readings from Last 3 Encounters:  07/05/22 175 lb (79.4 kg)   06/24/22 173 lb (78.5 kg)  04/26/22 162 lb 3.2 oz (73.6 kg)     BP Readings from Last 3 Encounters:  07/05/22 136/88  06/24/22 134/86  04/26/22 116/81     Physical Exam- Limited  Constitutional:  Body mass index is 31.5 kg/m. , not in acute distress, normal state of mind Eyes:  EOMI, no exophthalmos Musculoskeletal: no gross deformities, strength intact in all four extremities, no gross restriction of joint movements Skin:  no rashes, no hyperemia Neurological: no tremor with outstretched hands   CMP     Component Value Date/Time   NA 143 12/09/2021 1600   K 4.7 12/09/2021 1600   CL 107 (H) 12/09/2021 1600   CO2 22 12/09/2021 1600   GLUCOSE 100 (H) 12/09/2021 1600   GLUCOSE 105 (H) 03/30/2021 0438   BUN 12 12/09/2021 1600  CREATININE 0.80 12/09/2021 1600   CALCIUM 9.5 12/09/2021 1600   PROT 7.0 12/09/2021 1600   ALBUMIN 4.2 12/09/2021 1600   AST 17 12/09/2021 1600   ALT 20 12/09/2021 1600   ALKPHOS 233 (H) 12/09/2021 1600   BILITOT 0.2 12/09/2021 1600   GFRNONAA >60 03/30/2021 0438     CBC    Component Value Date/Time   WBC 4.9 12/09/2021 1600   WBC 6.4 04/28/2021 0806   RBC 3.99 12/09/2021 1600   RBC 3.79 (L) 04/28/2021 0806   HGB 10.7 (L) 12/09/2021 1600   HCT 34.2 12/09/2021 1600   PLT 334 12/09/2021 1600   MCV 86 12/09/2021 1600   MCH 26.8 12/09/2021 1600   MCH 26.6 04/28/2021 0806   MCHC 31.3 (L) 12/09/2021 1600   MCHC 30.2 04/28/2021 0806   RDW 13.7 12/09/2021 1600   LYMPHSABS 1.7 12/09/2021 1600   EOSABS 0.0 12/09/2021 1600   BASOSABS 0.0 12/09/2021 1600     Diabetic Labs (most recent): No results found for: "HGBA1C", "MICROALBUR"  Lipid Panel  No results found for: "CHOL", "TRIG", "HDL", "CHOLHDL", "VLDL", "LDLCALC", "LDLDIRECT", "LABVLDL"   Lab Results  Component Value Date   TSH 42.600 (H) 06/24/2022   TSH <0.005 (L) 03/03/2022   TSH <0.005 (L) 12/30/2021   TSH <0.005 (L) 10/27/2021   TSH <0.005 (L) 08/25/2021   TSH 0.108 (L)  06/28/2021   TSH <0.005 (L) 04/20/2021   TSH <0.010 (L) 03/29/2021   FREET4 0.10 (L) 06/24/2022   FREET4 1.62 03/03/2022   FREET4 2.10 (H) 12/30/2021   FREET4 2.12 (H) 10/27/2021   FREET4 1.66 08/25/2021   FREET4 0.38 (L) 06/28/2021   FREET4 0.75 (L) 04/20/2021   FREET4 4.79 (H) 03/29/2021       Uptake and Scan from 04/29/21 CLINICAL DATA:  Hyperthyroidism. TSH equal 0.005. Nervousness. And increased appetite. Difficulty sleeping. Hair loss. Heart palpitations.   EXAM: THYROID SCAN AND UPTAKE - 4 AND 24 HOURS   TECHNIQUE: Following oral administration of I-123 capsule, anterior planar imaging was acquired at 24 hours. Thyroid uptake was calculated with a thyroid probe at 4-6 hours and 24 hours.   RADIOPHARMACEUTICALS:  Three hundred uCi I-123 sodium iodide p.o.   COMPARISON:  None.   FINDINGS: Uniform uptake within thyroid gland.  No nodularity.   4 hour I-123 uptake = 71.8% (normal 5-20%)   24 hour I-123 uptake = 59.4% (normal 10-30%)   IMPRESSION: 1. Thyroid imaging and I 123 uptake consistent with Graves disease. 2. Rapid turnover of I 123 at 4 hour uptake.     Electronically Signed   By: Genevive Bi M.D.   On: 04/29/2021 16:05   Latest Reference Range & Units 10/27/21 08:36 12/30/21 16:25 03/03/22 10:46 06/24/22 09:27  TSH 0.450 - 4.500 uIU/mL <0.005 (L) <0.005 (L) <0.005 (L) 42.600 (H)  Triiodothyronine,Free,Serum 2.0 - 4.4 pg/mL   5.7 (H) <0.3 (L)  T4,Free(Direct) 0.82 - 1.77 ng/dL 1.30 (H) 8.65 (H) 7.84 0.10 (L)  (L): Data is abnormally low (H): Data is abnormally high Assessment & Plan:   1) Hypothyroidism- S/p RAI ablation for Graves Disease  she is being seen at a kind request of Anabel Halon, MD.  She is S/P RAI ablation on 05/07/21 and once again on 04/08/22.   Her previsit thyroid function tests are now suggestive of successful RAI ablation, now at a point where initiation of thyroid hormone replacement is warranted.  I discussed and  initiated Levothyroxine 75 mcg po daily before breakfast.   -  The correct intake of thyroid hormone (Levothyroxine, Synthroid), is on empty stomach first thing in the morning, with water, separated by at least 30 minutes from breakfast and other medications,  and separated by more than 4 hours from calcium, iron, multivitamins, acid reflux medications (PPIs).  - This medication is a life-long medication and will be needed to correct thyroid hormone imbalances for the rest of your life.  The dose may change from time to time, based on thyroid blood work.  - It is extremely important to be consistent taking this medication, near the same time each morning.  -AVOID TAKING PRODUCTS CONTAINING BIOTIN (commonly found in Hair, Skin, Nails vitamins) AS IT INTERFERES WITH THE VALIDITY OF THYROID FUNCTION BLOOD TESTS.       -Patient is advised to maintain close follow up with Anabel Halon, MD for primary care needs.     I spent  29  minutes in the care of the patient today including review of labs from Thyroid Function, CMP, and other relevant labs ; imaging/biopsy records (current and previous including abstractions from other facilities); face-to-face time discussing  her lab results and symptoms, medications doses, her options of short and long term treatment based on the latest standards of care / guidelines;   and documenting the encounter.  Krista Ramirez  participated in the discussions, expressed understanding, and voiced agreement with the above plans.  All questions were answered to her satisfaction. she is encouraged to contact clinic should she have any questions or concerns prior to her return visit.  Follow up plan: Return in about 3 months (around 10/05/2022) for Thyroid follow up, Previsit labs.   Thank you for involving me in the care of this pleasant patient, and I will continue to update you with her progress.   Ronny Bacon, [facility] [address] [place], [place] [address] Phone: [phone] Fax: [phone]  07/05/2022, 1:25 PM

## 2022-08-24 LAB — CBC WITH DIFFERENTIAL/PLATELET
Basophils Absolute: 0 10*3/uL (ref 0.0–0.2)
Basos: 1 %
EOS (ABSOLUTE): 0.1 10*3/uL (ref 0.0–0.4)
Eos: 2 %
Hematocrit: 32.9 % — ABNORMAL LOW (ref 34.0–46.6)
Hemoglobin: 10.3 g/dL — ABNORMAL LOW (ref 11.1–15.9)
Immature Grans (Abs): 0 10*3/uL (ref 0.0–0.1)
Immature Granulocytes: 0 %
Lymphocytes Absolute: 1.5 10*3/uL (ref 0.7–3.1)
Lymphs: 29 %
MCH: 30.1 pg (ref 26.6–33.0)
MCHC: 31.3 g/dL — ABNORMAL LOW (ref 31.5–35.7)
MCV: 96 fL (ref 79–97)
Monocytes Absolute: 0.4 10*3/uL (ref 0.1–0.9)
Monocytes: 9 %
Neutrophils Absolute: 3 10*3/uL (ref 1.4–7.0)
Neutrophils: 59 %
Platelets: 235 10*3/uL (ref 150–450)
RBC: 3.42 x10E6/uL — ABNORMAL LOW (ref 3.77–5.28)
RDW: 15.5 % — ABNORMAL HIGH (ref 11.7–15.4)
WBC: 5 10*3/uL (ref 3.4–10.8)

## 2022-08-24 LAB — BASIC METABOLIC PANEL
BUN/Creatinine Ratio: 13 (ref 12–28)
BUN: 15 mg/dL (ref 8–27)
CO2: 19 mmol/L — ABNORMAL LOW (ref 20–29)
Calcium: 8.8 mg/dL (ref 8.7–10.3)
Chloride: 106 mmol/L (ref 96–106)
Creatinine, Ser: 1.13 mg/dL — ABNORMAL HIGH (ref 0.57–1.00)
Glucose: 85 mg/dL (ref 70–99)
Potassium: 4.6 mmol/L (ref 3.5–5.2)
Sodium: 140 mmol/L (ref 134–144)
eGFR: 53 mL/min/{1.73_m2} — ABNORMAL LOW (ref >59)

## 2022-08-24 LAB — IRON,TIBC AND FERRITIN PANEL
Ferritin: 19 ng/mL (ref 15–150)
Iron Saturation: 17 % (ref 15–55)
Iron: 63 ug/dL (ref 27–139)
Total Iron Binding Capacity: 372 ug/dL (ref 250–450)
UIBC: 309 ug/dL (ref 118–369)

## 2022-08-30 ENCOUNTER — Encounter: Payer: Self-pay | Admitting: Internal Medicine

## 2022-08-30 ENCOUNTER — Ambulatory Visit (INDEPENDENT_AMBULATORY_CARE_PROVIDER_SITE_OTHER): Payer: Medicare Other | Admitting: Internal Medicine

## 2022-08-30 VITALS — BP 114/74 | HR 70 | Ht 62.5 in | Wt 173.8 lb

## 2022-08-30 DIAGNOSIS — E782 Mixed hyperlipidemia: Secondary | ICD-10-CM

## 2022-08-30 DIAGNOSIS — Z7901 Long term (current) use of anticoagulants: Secondary | ICD-10-CM

## 2022-08-30 DIAGNOSIS — M1811 Unilateral primary osteoarthritis of first carpometacarpal joint, right hand: Secondary | ICD-10-CM | POA: Insufficient documentation

## 2022-08-30 DIAGNOSIS — I700 Atherosclerosis of aorta: Secondary | ICD-10-CM | POA: Diagnosis not present

## 2022-08-30 DIAGNOSIS — I480 Paroxysmal atrial fibrillation: Secondary | ICD-10-CM | POA: Diagnosis not present

## 2022-08-30 DIAGNOSIS — K219 Gastro-esophageal reflux disease without esophagitis: Secondary | ICD-10-CM | POA: Insufficient documentation

## 2022-08-30 DIAGNOSIS — R739 Hyperglycemia, unspecified: Secondary | ICD-10-CM

## 2022-08-30 DIAGNOSIS — E890 Postprocedural hypothyroidism: Secondary | ICD-10-CM | POA: Diagnosis not present

## 2022-08-30 DIAGNOSIS — F411 Generalized anxiety disorder: Secondary | ICD-10-CM

## 2022-08-30 DIAGNOSIS — D649 Anemia, unspecified: Secondary | ICD-10-CM

## 2022-08-30 HISTORY — DX: Gastro-esophageal reflux disease without esophagitis: K21.9

## 2022-08-30 MED ORDER — FAMOTIDINE 40 MG PO TABS: 40.0000 mg | ORAL_TABLET | Freq: Every day | ORAL | 1 refills | Status: DC

## 2022-08-30 NOTE — Assessment & Plan Note (Signed)
Noted on CXR Will check lipid profile later and decide about statin

## 2022-08-30 NOTE — Assessment & Plan Note (Signed)
 Well-controlled with Celexa

## 2022-08-30 NOTE — Patient Instructions (Addendum)
Please start taking Pepcid once daily.  Please apply Voltaren gel as needed for thumb pain.  Please apply thumb brace for thumb pain.    Please continue to take medications as prescribed.  Please continue to follow low salt diet and perform moderate exercise/walking at least 150 mins/week.  Please get fasting blood tests done before the next visit.

## 2022-08-30 NOTE — Progress Notes (Signed)
Established Patient Office Visit  Subjective:  Patient ID: Krista Ramirez, female    DOB: 1956-02-04  Age: 67 y.o. MRN: [id number]  CC:  Chief Complaint  Patient presents with   Hyperthyroidism    Follow up    Anemia    Follow up    Gastroesophageal Reflux    Patient is having indigestion , lots of burping     HPI Krista Ramirez is a 67 y.o. female with past medical history of hyperthyroidism, atrial fibrillation and GAD who presents for f/u of her chronic medical conditions.  She has had radioactive ablation X 2 of thyroid for hyperthyroidism. Her thyroid function test showed very high TSH in 05/24 and her dose of levothyroxine was increased to 75 mcg QD. She denies tremors or palpitations currently.  She reports improvement in fatigue and has lost weight now.  She was started on Cardizem and Eliquis for episode of A-fib with RVR during her hospital stay, which was likely provoked by uncontrolled hyperthyroidism.  She has seen cardiologist since then.  She currently denies any chest pain, dyspnea or palpitations.  Her BP has been well controlled at home.  She reports severe epigastric pain and burning episodes of about a week ago.  She tried OTC antacids with some relief.  She had nausea and NBNB vomiting as well.  Denies any melena or hematochezia.  She also complains of episodic pain of right thumb base, which is 8-10/10 during flareup, sharp, nonradiating and leads to thumb stiffness.  Denies any recent injury.  Past Medical History:  Diagnosis Date   Anemia 04/27/2022   Anxiety 03/17/2021   COPD (chronic obstructive pulmonary disease) (HCC)    COPD (chronic obstructive pulmonary disease) (HCC)    Gastroesophageal reflux disease 08/30/2022   Hyperthyroidism     Past Surgical History:  Procedure Laterality Date   SKIN GRAFT     TUBAL LIGATION      Family History  Problem Relation Age of Onset   Hypertension Mother     Social History   Socioeconomic History    Marital status: Married    Spouse name: Not on file   Number of children: Not on file   Years of education: Not on file   Highest education level: Not on file  Occupational History   Not on file  Tobacco Use   Smoking status: Former    Current packs/day: 0.00    Average packs/day: 1 pack/day for 30.0 years (30.0 ttl pk-yrs)    Types: Cigarettes    Start date: 02/23/1991    Quit date: 02/22/2021    Years since quitting: 1.5   Smokeless tobacco: Never  Vaping Use   Vaping status: Never Used  Substance and Sexual Activity   Alcohol use: Never   Drug use: Never   Sexual activity: Yes    Birth control/protection: None  Other Topics Concern   Not on file  Social History Narrative   Not on file   Social Determinants of Health   Financial Resource Strain: Low Risk  (09/02/2021)   Overall Financial Resource Strain (CARDIA)    Difficulty of Paying Living Expenses: Not hard at all  Food Insecurity: No Food Insecurity (09/02/2021)   Hunger Vital Sign    Worried About Running Out of Food in the Last Year: Never true    Ran Out of Food in the Last Year: Never true  Transportation Needs: No Transportation Needs (09/02/2021)   PRAPARE - Transportation    Lack of Transportation (  Medical): No    Lack of Transportation (Non-Medical): No  Physical Activity: Sufficiently Active (09/02/2021)   Exercise Vital Sign    Days of Exercise per Week: 5 days    Minutes of Exercise per Session: 150+ min  Stress: No Stress Concern Present (09/02/2021)   [employer] of Occupational Health - Occupational Stress Questionnaire    Feeling of Stress : Not at all  Social Connections: Moderately Isolated (09/02/2021)   Social Connection and Isolation Panel [NHANES]    Frequency of Communication with Friends and Family: Three times a week    Frequency of Social Gatherings with Friends and Family: Once a week    Attends Religious Services: Never    Database administrator or Organizations: No    Attends Occupational hygienist Meetings: Never    Marital Status: Married  Catering manager Violence: Not At Risk (09/02/2021)   Humiliation, Afraid, Rape, and Kick questionnaire    Fear of Current or Ex-Partner: No    Emotionally Abused: No    Physically Abused: No    Sexually Abused: No    Outpatient Medications Prior to Visit  Medication Sig Dispense Refill   albuterol (VENTOLIN HFA) 108 (90 Base) MCG/ACT inhaler Inhale 2 puffs into the lungs every 6 (six) hours as needed for wheezing or shortness of breath. 8 g 5   apixaban (ELIQUIS) 5 MG TABS tablet Take 1 tablet (5 mg total) by mouth 2 (two) times daily. 60 tablet 5   benzonatate (TESSALON) 200 MG capsule Take 1 capsule (200 mg total) by mouth 3 (three) times daily as needed for cough. Swallow whole, do not chew (Patient not taking: Reported on 03/10/2022) 21 capsule 0   citalopram (CELEXA) 10 MG tablet Take 1 tablet (10 mg total) by mouth daily. 90 tablet 1   diltiazem (CARDIZEM CD) 120 MG 24 hr capsule Take 1 capsule (120 mg total) by mouth daily. 30 capsule 5   levothyroxine (SYNTHROID) 75 MCG tablet Take 1 tablet (75 mcg total) by mouth daily before breakfast. 90 tablet 1   Magnesium 250 MG TABS Take 250 mg by mouth daily.     No facility-administered medications prior to visit.    No Known Allergies  ROS Review of Systems  Constitutional:  Positive for fatigue. Negative for chills and fever.  HENT:  Negative for congestion, postnasal drip, sinus pressure and sinus pain.   Eyes:  Negative for pain and discharge.  Respiratory:  Negative for cough and shortness of breath.   Cardiovascular:  Negative for chest pain and palpitations.  Gastrointestinal:  Positive for abdominal pain and nausea. Negative for diarrhea and vomiting.  Endocrine: Negative for polydipsia and polyuria.  Genitourinary:  Negative for dysuria and hematuria.  Musculoskeletal:  Positive for arthralgias (Right thumb). Negative for neck pain and neck stiffness.  Skin:   Negative for rash.  Neurological:  Negative for dizziness and weakness.  Psychiatric/Behavioral:  Negative for agitation and behavioral problems. The patient is nervous/anxious.       Objective:    Physical Exam Vitals reviewed.  Constitutional:      General: She is not in acute distress.    Appearance: She is not diaphoretic.  HENT:     Head: Normocephalic and atraumatic.     Nose: No congestion.     Mouth/Throat:     Mouth: Mucous membranes are moist.  Eyes:     General: No scleral icterus.    Extraocular Movements: Extraocular movements intact.  Cardiovascular:  Rate and Rhythm: Normal rate and regular rhythm.     Heart sounds: Normal heart sounds. No murmur heard. Pulmonary:     Breath sounds: Normal breath sounds. No wheezing or rales.  Abdominal:     Palpations: Abdomen is soft.     Tenderness: There is abdominal tenderness (Mild, epigastric).  Musculoskeletal:     Cervical back: Neck supple. No tenderness.     Right lower leg: No edema.     Left lower leg: No edema.  Skin:    General: Skin is warm.     Findings: No rash.  Neurological:     General: No focal deficit present.     Mental Status: She is alert and oriented to person, place, and time.     Sensory: No sensory deficit.     Motor: No weakness.  Psychiatric:        Mood and Affect: Mood normal.        Behavior: Behavior normal.     BP 114/74 (BP Location: Right Arm, Patient Position: Sitting, Cuff Size: Normal)   Pulse 70   Ht 5' 2.5" (1.588 m)   Wt 173 lb 12.8 oz (78.8 kg)   SpO2 95%   BMI 31.28 kg/m  Wt Readings from Last 3 Encounters:  08/30/22 173 lb 12.8 oz (78.8 kg)  07/05/22 175 lb (79.4 kg)  06/24/22 173 lb (78.5 kg)    Lab Results  Component Value Date   TSH 42.600 (H) 06/24/2022   Lab Results  Component Value Date   WBC 5.0 08/23/2022   HGB 10.3 (L) 08/23/2022   HCT 32.9 (L) 08/23/2022   MCV 96 08/23/2022   PLT 235 08/23/2022   Lab Results  Component Value Date   NA  140 08/23/2022   K 4.6 08/23/2022   CO2 19 (L) 08/23/2022   GLUCOSE 85 08/23/2022   BUN 15 08/23/2022   CREATININE 1.13 (H) 08/23/2022   BILITOT 0.2 12/09/2021   ALKPHOS 233 (H) 12/09/2021   AST 17 12/09/2021   ALT 20 12/09/2021   PROT 7.0 12/09/2021   ALBUMIN 4.2 12/09/2021   CALCIUM 8.8 08/23/2022   ANIONGAP 9 03/30/2021   EGFR 53 (L) 08/23/2022   No results found for: "CHOL" No results found for: "HDL" No results found for: "LDLCALC" No results found for: "TRIG" No results found for: "CHOLHDL" No results found for: "HGBA1C"    Assessment & Plan:   Problem List Items Addressed This Visit    Atrial fibrillation (HCC) Had A-fib with RVR during recent hospital visit, likely due to uncontrolled hyperthyroidism Currently in sinus rhythm Currently on Diltiazem and Eliquis -advised to continue for now, refills provided Followed by cardiology  Aortic atherosclerosis (HCC) Noted on CXR Will check lipid profile later and decide about statin  Postablative hypothyroidism Lab Results  Component Value Date   TSH 42.600 (H) 06/24/2022   On Levothyroxine 75 mcg QD Followed by Endocrinology Her weight gain was likely due to her being in hypothyroid state  GAD (generalized anxiety disorder) Well-controlled with Celexa  Anemia Denies any active signs of bleeding currently On Eliquis for history of A-fib Checked CBC and iron profile Advised to take ferrous sulfate 325 mg QD  Gastroesophageal reflux disease Recent episode of epigastric pain/burning and nausea likely due to acute gastritis Started Pepcid Avoid hot and spicy food If persistent, may need EGD  Arthritis of carpometacarpal (CMC) joint of right thumb Right thumb base pain likely due to [facility] arthritis Can apply Voltaren gel  as needed Tylenol arthritis as needed for pain Advised to apply thumb splint     Meds ordered this encounter  Medications   famotidine (PEPCID) 40 MG tablet    Sig: Take 1 tablet (40  mg total) by mouth daily.    Dispense:  90 tablet    Refill:  1    Follow-up: Return in about 4 months (around 12/31/2022) for Annual physical.    Anabel Halon, MD

## 2022-08-30 NOTE — Assessment & Plan Note (Signed)
Right thumb base pain likely due to [facility] arthritis Can apply Voltaren gel as needed Tylenol arthritis as needed for pain Advised to apply thumb splint

## 2022-08-30 NOTE — Assessment & Plan Note (Signed)
Denies any active signs of bleeding currently On Eliquis for history of A-fib Checked CBC and iron profile Advised to take ferrous sulfate 325 mg QD

## 2022-08-30 NOTE — Assessment & Plan Note (Signed)
Recent episode of epigastric pain/burning and nausea likely due to acute gastritis Started Pepcid Avoid hot and spicy food If persistent, may need EGD

## 2022-08-30 NOTE — Assessment & Plan Note (Signed)
Had A-fib with RVR during recent hospital visit, likely due to uncontrolled hyperthyroidism Currently in sinus rhythm Currently on Diltiazem and Eliquis -advised to continue for now, refills provided Followed by cardiology 

## 2022-08-30 NOTE — Assessment & Plan Note (Signed)
Lab Results  Component Value Date   TSH 42.600 (H) 06/24/2022   On Levothyroxine 75 mcg QD Followed by Endocrinology Her weight gain was likely due to her being in hypothyroid state

## 2022-09-29 ENCOUNTER — Other Ambulatory Visit (HOSPITAL_COMMUNITY)
Admission: RE | Admit: 2022-09-29 | Discharge: 2022-09-29 | Disposition: A | Discharge status: Home / Self Care | Payer: Medicare Other | Source: Ambulatory Visit | Attending: Nurse Practitioner | Admitting: Nurse Practitioner

## 2022-09-29 DIAGNOSIS — E890 Postprocedural hypothyroidism: Secondary | ICD-10-CM | POA: Insufficient documentation

## 2022-09-29 LAB — TSH: TSH: 19.05 u[IU]/mL — ABNORMAL HIGH (ref 0.350–4.500)

## 2022-09-29 LAB — T4, FREE: Free T4: 0.72 ng/dL (ref 0.61–1.12)

## 2022-10-03 NOTE — Patient Instructions (Signed)

## 2022-10-05 ENCOUNTER — Encounter: Payer: Self-pay | Admitting: Nurse Practitioner

## 2022-10-05 ENCOUNTER — Ambulatory Visit (INDEPENDENT_AMBULATORY_CARE_PROVIDER_SITE_OTHER): Payer: Medicare Other | Admitting: Nurse Practitioner

## 2022-10-05 VITALS — BP 149/81 | HR 63 | Ht 62.5 in | Wt 171.6 lb

## 2022-10-05 DIAGNOSIS — E890 Postprocedural hypothyroidism: Secondary | ICD-10-CM | POA: Diagnosis not present

## 2022-10-05 MED ORDER — LEVOTHYROXINE SODIUM 100 MCG PO TABS: 100.0000 ug | ORAL_TABLET | Freq: Every day | ORAL | 1 refills | Status: DC

## 2022-10-05 NOTE — Progress Notes (Signed)
10/05/2022     Endocrinology Follow Up Note     Subjective:    Patient ID: Krista Ramirez, female    DOB: [birth year], PCP Anabel Halon, MD.   Past Medical History:  Diagnosis Date   Anemia 04/27/2022   Anxiety 03/17/2021   COPD (chronic obstructive pulmonary disease) (HCC)    COPD (chronic obstructive pulmonary disease) (HCC)    Gastroesophageal reflux disease 08/30/2022   Hyperthyroidism     Past Surgical History:  Procedure Laterality Date   SKIN GRAFT     TUBAL LIGATION      Social History   Socioeconomic History   Marital status: Married    Spouse name: Not on file   Number of children: Not on file   Years of education: Not on file   Highest education level: Not on file  Occupational History   Not on file  Tobacco Use   Smoking status: Former    Current packs/day: 0.00    Average packs/day: 1 pack/day for 30.0 years (30.0 ttl pk-yrs)    Types: Cigarettes    Start date: 02/23/1991    Quit date: 02/22/2021    Years since quitting: 1.6   Smokeless tobacco: Never  Vaping Use   Vaping status: Never Used  Substance and Sexual Activity   Alcohol use: Never   Drug use: Never   Sexual activity: Yes    Birth control/protection: None  Other Topics Concern   Not on file  Social History Narrative   Not on file   Social Determinants of Health   Financial Resource Strain: Low Risk  (09/02/2021)   Overall Financial Resource Strain (CARDIA)    Difficulty of Paying Living Expenses: Not hard at all  Food Insecurity: No Food Insecurity (09/02/2021)   Hunger Vital Sign    Worried About Running Out of Food in the Last Year: Never true    Ran Out of Food in the Last Year: Never true  Transportation Needs: No Transportation Needs (09/02/2021)   PRAPARE - Administrator, Civil Service (Medical): No    Lack of Transportation (Non-Medical): No  Physical Activity: Sufficiently Active (09/02/2021)   Exercise Vital Sign    Days of Exercise per Week: 5  days    Minutes of Exercise per Session: 150+ min  Stress: No Stress Concern Present (09/02/2021)   [employer] of Occupational Health - Occupational Stress Questionnaire    Feeling of Stress : Not at all  Social Connections: Moderately Isolated (09/02/2021)   Social Connection and Isolation Panel [NHANES]    Frequency of Communication with Friends and Family: Three times a week    Frequency of Social Gatherings with Friends and Family: Once a week    Attends Religious Services: Never    Database administrator or Organizations: No    Attends Banker Meetings: Never    Marital Status: Married    Family History  Problem Relation Age of Onset   Hypertension Mother     Outpatient Encounter Medications as of 10/05/2022  Medication Sig   albuterol (VENTOLIN HFA) 108 (90 Base) MCG/ACT inhaler Inhale 2 puffs into the lungs every 6 (six) hours as needed for wheezing or shortness of breath.   amoxicillin-clavulanate (AUGMENTIN) 875-125 MG tablet Take 1 tablet by mouth 2 (two) times daily.   apixaban (ELIQUIS) 5 MG TABS tablet Take 1 tablet (5 mg total) by mouth 2 (two) times daily.   citalopram (CELEXA) 10 MG tablet Take  1 tablet (10 mg total) by mouth daily.   diltiazem (CARDIZEM CD) 120 MG 24 hr capsule Take 1 capsule (120 mg total) by mouth daily.   famotidine (PEPCID) 40 MG tablet Take 1 tablet (40 mg total) by mouth daily.   magic mouthwash (lidocaine, diphenhydrAMINE, alum & mag hydroxide) suspension Swish and spit 5 mLs 3 (three) times daily.   Magnesium 250 MG TABS Take 250 mg by mouth daily.   predniSONE (STERAPRED UNI-PAK 21 TAB) 10 MG (21) TBPK tablet Take 10 mg by mouth daily.   [DISCONTINUED] levothyroxine (SYNTHROID) 75 MCG tablet Take 1 tablet (75 mcg total) by mouth daily before breakfast.   benzonatate (TESSALON) 200 MG capsule Take 1 capsule (200 mg total) by mouth 3 (three) times daily as needed for cough. Swallow whole, do not chew (Patient not taking:  Reported on 03/10/2022)   levothyroxine (SYNTHROID) 100 MCG tablet Take 1 tablet (100 mcg total) by mouth daily before breakfast.   No facility-administered encounter medications on file as of 10/05/2022.    ALLERGIES: No Known Allergies  VACCINATION STATUS: Immunization History  Administered Date(s) Administered   Fluad Quad(high Dose 65+) 12/06/2021   Influenza-Unspecified 12/03/2020   PNEUMOCOCCAL CONJUGATE-20 12/20/2021   Pfizer Covid-19 Vaccine Bivalent Booster 71yrs & up 02/17/2019, 03/10/2019, 11/04/2019   Zoster Recombinant(Shingrix) 12/20/2021, 02/25/2022     HPI  Krista Ramirez is 67 y.o. female who presents today with a medical history as above. she is being seen in follow up after being seen in consultation for hyperthyroidism requested by Anabel Halon, MD.  she has been dealing with symptoms of anxiety, insomnia, irritability, palpitations, weight loss, and tremors for 6-8 months. These symptoms are progressively worsening and troubling to her.  her most recent thyroid labs revealed suppressed TSH of < 0.010, high Free T3 of 26.5 and high Free T4 of 4.79 on 03/29/21. she denies dysphagia, choking, shortness of breath, no recent voice change.    she does have family history of thyroid dysfunction in her siblings, but denies family hx of thyroid cancer. she denies personal history of goiter. she is currently on Methimazole 10 mg po twice daily. Denies use of Biotin containing supplements.  she is willing to proceed with appropriate work up and therapy for thyrotoxicosis.   Review of systems  Constitutional: + Minimally fluctuating body weight,  current Body mass index is 30.89 kg/m. , no fatigue, no subjective hyperthermia, no subjective hypothermia Eyes: no blurry vision, no xerophthalmia ENT: no sore throat, no nodules palpated in throat, no dysphagia/odynophagia, no hoarseness Cardiovascular: no chest pain, no shortness of breath, no palpitations, no leg  swelling Respiratory: no cough, no shortness of breath Gastrointestinal: no nausea/vomiting, + diarrhea, belching, acid reflux (recently treated with Abx and prednisone for viral URI) Musculoskeletal: no muscle/joint aches Skin: no rashes, no hyperemia Neurological: no tremors, no numbness, no tingling, no dizziness Psychiatric: no depression, no anxiety   Objective:    BP (!) 149/81 (BP Location: Left Arm, Patient Position: Sitting, Cuff Size: Large)   Pulse 63   Ht 5' 2.5" (1.588 m)   Wt 171 lb 9.6 oz (77.8 kg)   BMI 30.89 kg/m   Wt Readings from Last 3 Encounters:  10/05/22 171 lb 9.6 oz (77.8 kg)  08/30/22 173 lb 12.8 oz (78.8 kg)  07/05/22 175 lb (79.4 kg)     BP Readings from Last 3 Encounters:  10/05/22 (!) 149/81  08/30/22 114/74  07/05/22 136/88     Physical Exam- Limited  Constitutional:  Body mass index is 30.89 kg/m. , not in acute distress, normal state of mind Eyes:  EOMI, no exophthalmos Musculoskeletal: no gross deformities, strength intact in all four extremities, no gross restriction of joint movements Skin:  no rashes, no hyperemia Neurological: no tremor with outstretched hands   CMP     Component Value Date/Time   NA 140 08/23/2022 1017   K 4.6 08/23/2022 1017   CL 106 08/23/2022 1017   CO2 19 (L) 08/23/2022 1017   GLUCOSE 85 08/23/2022 1017   GLUCOSE 105 (H) 03/30/2021 0438   BUN 15 08/23/2022 1017   CREATININE 1.13 (H) 08/23/2022 1017   CALCIUM 8.8 08/23/2022 1017   PROT 7.0 12/09/2021 1600   ALBUMIN 4.2 12/09/2021 1600   AST 17 12/09/2021 1600   ALT 20 12/09/2021 1600   ALKPHOS 233 (H) 12/09/2021 1600   BILITOT 0.2 12/09/2021 1600   GFRNONAA >60 03/30/2021 0438     CBC    Component Value Date/Time   WBC 5.0 08/23/2022 1017   WBC 6.4 04/28/2021 0806   RBC 3.42 (L) 08/23/2022 1017   RBC 3.79 (L) 04/28/2021 0806   HGB 10.3 (L) 08/23/2022 1017   HCT 32.9 (L) 08/23/2022 1017   PLT 235 08/23/2022 1017   MCV 96 08/23/2022 1017    MCH 30.1 08/23/2022 1017   MCH 26.6 04/28/2021 0806   MCHC 31.3 (L) 08/23/2022 1017   MCHC 30.2 04/28/2021 0806   RDW 15.5 (H) 08/23/2022 1017   LYMPHSABS 1.5 08/23/2022 1017   EOSABS 0.1 08/23/2022 1017   BASOSABS 0.0 08/23/2022 1017     Diabetic Labs (most recent): No results found for: "HGBA1C", "MICROALBUR"  Lipid Panel  No results found for: "CHOL", "TRIG", "HDL", "CHOLHDL", "VLDL", "LDLCALC", "LDLDIRECT", "LABVLDL"   Lab Results  Component Value Date   TSH 19.050 (H) 09/29/2022   TSH 42.600 (H) 06/24/2022   TSH <0.005 (L) 03/03/2022   TSH <0.005 (L) 12/30/2021   TSH <0.005 (L) 10/27/2021   TSH <0.005 (L) 08/25/2021   TSH 0.108 (L) 06/28/2021   TSH <0.005 (L) 04/20/2021   TSH <0.010 (L) 03/29/2021   FREET4 0.72 09/29/2022   FREET4 0.10 (L) 06/24/2022   FREET4 1.62 03/03/2022   FREET4 2.10 (H) 12/30/2021   FREET4 2.12 (H) 10/27/2021   FREET4 1.66 08/25/2021   FREET4 0.38 (L) 06/28/2021   FREET4 0.75 (L) 04/20/2021   FREET4 4.79 (H) 03/29/2021       Uptake and Scan from 04/29/21 CLINICAL DATA:  Hyperthyroidism. TSH equal 0.005. Nervousness. And increased appetite. Difficulty sleeping. Hair loss. Heart palpitations.   EXAM: THYROID SCAN AND UPTAKE - 4 AND 24 HOURS   TECHNIQUE: Following oral administration of I-123 capsule, anterior planar imaging was acquired at 24 hours. Thyroid uptake was calculated with a thyroid probe at 4-6 hours and 24 hours.   RADIOPHARMACEUTICALS:  Three hundred uCi I-123 sodium iodide p.o.   COMPARISON:  None.   FINDINGS: Uniform uptake within thyroid gland.  No nodularity.   4 hour I-123 uptake = 71.8% (normal 5-20%)   24 hour I-123 uptake = 59.4% (normal 10-30%)   IMPRESSION: 1. Thyroid imaging and I 123 uptake consistent with Graves disease. 2. Rapid turnover of I 123 at 4 hour uptake.     Electronically Signed   By: Genevive Bi M.D.   On: 04/29/2021 16:05   Latest Reference Range & Units 10/27/21 08:36  12/30/21 16:25 03/03/22 10:46 06/24/22 09:27 09/29/22 11:35 09/29/22 11:36  TSH 0.350 -  4.500 uIU/mL <0.005 (L) <0.005 (L) <0.005 (L) 42.600 (H) 19.050 (H)   Triiodothyronine,Free,Serum 2.0 - 4.4 pg/mL   5.7 (H) <0.3 (L)    T4,Free(Direct) 0.61 - 1.12 ng/dL 1.61 (H) 0.96 (H) 0.45 0.10 (L)  0.72  (L): Data is abnormally low (H): Data is abnormally high Assessment & Plan:   1) Hypothyroidism- S/p RAI ablation for Graves Disease  she is being seen at a kind request of Anabel Halon, MD.  She is S/P RAI ablation on 05/07/21 and once again on 04/08/22.   Her previsit TFTs are consistent with under-replacement.  She is advised to increase her Levothyroxine to 100 mcg po daily before breakfast.  She does note she is starting to feel better, more like herself.   - The correct intake of thyroid hormone (Levothyroxine, Synthroid), is on empty stomach first thing in the morning, with water, separated by at least 30 minutes from breakfast and other medications,  and separated by more than 4 hours from calcium, iron, multivitamins, acid reflux medications (PPIs).  - This medication is a life-long medication and will be needed to correct thyroid hormone imbalances for the rest of your life.  The dose may change from time to time, based on thyroid blood work.  - It is extremely important to be consistent taking this medication, near the same time each morning.  -AVOID TAKING PRODUCTS CONTAINING BIOTIN (commonly found in Hair, Skin, Nails vitamins) AS IT INTERFERES WITH THE VALIDITY OF THYROID FUNCTION BLOOD TESTS.       -Patient is advised to maintain close follow up with Anabel Halon, MD for primary care needs.    I spent  24  minutes in the care of the patient today including review of labs from Thyroid Function, CMP, and other relevant labs ; imaging/biopsy records (current and previous including abstractions from other facilities); face-to-face time discussing  her lab results and symptoms,  medications doses, her options of short and long term treatment based on the latest standards of care / guidelines;   and documenting the encounter.  Krista Ramirez  participated in the discussions, expressed understanding, and voiced agreement with the above plans.  All questions were answered to her satisfaction. she is encouraged to contact clinic should she have any questions or concerns prior to her return visit.  Follow up plan: Return in about 3 months (around 01/05/2023) for Thyroid follow up, Previsit labs.   Thank you for involving me in the care of this pleasant patient, and I will continue to update you with her progress.   Ronny Bacon, [facility] [address] [place], [place] [address] Phone: [phone] Fax: [phone]  10/05/2022, 3:08 PM

## 2022-10-12 ENCOUNTER — Other Ambulatory Visit: Payer: Self-pay | Admitting: Internal Medicine

## 2022-10-12 ENCOUNTER — Other Ambulatory Visit: Payer: Self-pay

## 2022-10-12 ENCOUNTER — Telehealth: Payer: Self-pay | Admitting: Internal Medicine

## 2022-10-12 DIAGNOSIS — K219 Gastro-esophageal reflux disease without esophagitis: Secondary | ICD-10-CM

## 2022-10-12 MED ORDER — PANTOPRAZOLE SODIUM 40 MG PO TBEC: 40.0000 mg | DELAYED_RELEASE_TABLET | Freq: Every day | ORAL | 3 refills | Status: DC

## 2022-10-12 NOTE — Telephone Encounter (Signed)
Pt called in regard to famotidine (PEPCID) 40 MG tablet [[id number]]  States that it is not helping, wants to see If provider can send in new alternative med

## 2022-10-12 NOTE — Telephone Encounter (Signed)
Spoke with patient.

## 2022-10-15 ENCOUNTER — Other Ambulatory Visit: Payer: Self-pay | Admitting: Internal Medicine

## 2022-10-15 DIAGNOSIS — I4891 Unspecified atrial fibrillation: Secondary | ICD-10-CM

## 2022-10-16 ENCOUNTER — Other Ambulatory Visit: Payer: Self-pay | Admitting: Internal Medicine

## 2022-10-16 DIAGNOSIS — F411 Generalized anxiety disorder: Secondary | ICD-10-CM

## 2022-11-16 ENCOUNTER — Other Ambulatory Visit: Payer: Self-pay | Admitting: Internal Medicine

## 2022-11-16 DIAGNOSIS — I4891 Unspecified atrial fibrillation: Secondary | ICD-10-CM

## 2022-12-30 ENCOUNTER — Ambulatory Visit: Payer: Medicare Other | Attending: Cardiology | Admitting: Cardiology

## 2022-12-30 ENCOUNTER — Telehealth: Payer: Self-pay | Admitting: Cardiology

## 2022-12-30 ENCOUNTER — Encounter: Payer: Self-pay | Admitting: Cardiology

## 2022-12-30 VITALS — BP 122/68 | HR 60 | Ht 62.5 in | Wt 168.0 lb

## 2022-12-30 DIAGNOSIS — D6869 Other thrombophilia: Secondary | ICD-10-CM

## 2022-12-30 DIAGNOSIS — I480 Paroxysmal atrial fibrillation: Secondary | ICD-10-CM | POA: Diagnosis not present

## 2022-12-30 NOTE — Patient Instructions (Signed)
Medication Instructions:  Your physician recommends that you continue on your current medications as directed. Please refer to the Current Medication list given to you today.  *If you need a refill on your cardiac medications before your next appointment, please call your pharmacy*   Lab Work: None If you have labs (blood work) drawn today and your tests are completely normal, you will receive your results only by: MyChart Message (if you have MyChart) OR A paper copy in the mail If you have any lab test that is abnormal or we need to change your treatment, we will call you to review the results.   Testing/Procedures: 30 Day Cardiac Event Monitor   Follow-Up: At [facility], you and your health needs are our priority.  As part of our continuing mission to provide you with exceptional heart care, we have created designated Provider Care Teams.  These Care Teams include your primary Cardiologist (physician) and Advanced Practice Providers (APPs -  Physician Assistants and Nurse Practitioners) who all work together to provide you with the care you need, when you need it.  We recommend signing up for the patient portal called "MyChart".  Sign up information is provided on this After Visit Summary.  MyChart is used to connect with patients for Virtual Visits (Telemedicine).  Patients are able to view lab/test results, encounter notes, upcoming appointments, etc.  Non-urgent messages can be sent to your provider as well.   To learn more about what you can do with MyChart, go to [email or web address].    Your next appointment:   6 month(s)  Provider:   You may see Dina Rich, MD or one of the following Advanced Practice Providers on your designated Care Team:   Randall An, PA-C  Jacolyn Reedy, [place]     Other Instructions

## 2022-12-30 NOTE — Progress Notes (Signed)
Clinical Summary Ms. Keagy is a 67 y.o.female seen today for follow up of the following medical problems.     1.PAF -new diagnosis 03/2021 admission in setting of severe hyperthyroidism -  converted with IV cardizem     - no recent palpitations - compliant with meds. No bleeding on eliquis    09/2022 TSH 19, free T4 0.72 - no recent palpitations - compliant with meds - no bleeding on eliquis.       2.Hyperthyoidism - followed by endocrinology.  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 30 tablet 3   albuterol (VENTOLIN HFA) 108 (90 Base) MCG/ACT inhaler Inhale 2 puffs into the lungs every 6 (six) hours as needed for wheezing or shortness of breath. 8 g 5   amoxicillin-clavulanate (AUGMENTIN) 875-125 MG tablet Take 1 tablet by mouth 2 (two) times daily.     benzonatate (TESSALON) 200 MG capsule Take 1 capsule (200 mg total) by mouth 3 (three) times daily as needed for cough. Swallow whole, do not chew (Patient not taking: Reported on 03/10/2022) 21 capsule 0   citalopram (CELEXA) 10 MG tablet Take 1 tablet (10 mg total) by mouth daily. 90 tablet 1   diltiazem (CARDIZEM CD) 120 MG 24 hr capsule Take 1 capsule (120 mg total) by mouth daily. 30 capsule 5   ELIQUIS 5 MG TABS tablet Take 1 tablet (5 mg total) by mouth 2 (two) times daily. 60 tablet 5   levothyroxine (SYNTHROID) 100 MCG tablet Take 1 tablet (100 mcg total) by mouth daily before breakfast. 90 tablet  1   magic mouthwash (lidocaine, diphenhydrAMINE, alum & mag hydroxide) suspension Swish and spit 5 mLs 3 (three) times daily.     Magnesium 250 MG TABS Take 250 mg by mouth daily.     predniSONE (STERAPRED UNI-PAK 21 TAB) 10 MG (21) TBPK tablet Take 10 mg by mouth daily.     No current facility-administered medications for this visit.     Past Surgical History:  Procedure Laterality Date   SKIN GRAFT     TUBAL LIGATION       No Known Allergies    Family History  Problem Relation Age of Onset   Hypertension Mother      Social History Ms. Portilla reports that she quit smoking about 22 months ago. Her smoking use included cigarettes. She started smoking about 31 years ago. She has a 30 pack-year smoking history. She has never used smokeless tobacco. Ms. Mansoor reports no history of alcohol use.   Review of Systems CONSTITUTIONAL: No weight loss, fever, chills, weakness or fatigue.  HEENT: Eyes: No visual loss, blurred vision, double vision or yellow sclerae.No hearing loss, sneezing, congestion, runny nose or sore throat.  SKIN: No rash or itching.  CARDIOVASCULAR: per hpi RESPIRATORY: No shortness of breath, cough or sputum.  GASTROINTESTINAL: No anorexia, nausea, vomiting or diarrhea. No abdominal pain or blood.  GENITOURINARY: No burning on urination, no polyuria  NEUROLOGICAL: No headache, dizziness, syncope, paralysis, ataxia, numbness or tingling in the extremities. No change in bowel or bladder control.  MUSCULOSKELETAL: No muscle, back pain, joint pain or stiffness.  LYMPHATICS: No enlarged nodes. No history of splenectomy.  PSYCHIATRIC: No history of depression or anxiety.  ENDOCRINOLOGIC: No reports of sweating, cold or heat intolerance. No polyuria or polydipsia.  Marland Kitchen   Physical Examination Today's Vitals   12/30/22 1528  BP: 122/68  Pulse: 60  SpO2: 96%  Weight: 168 lb (76.2 kg)  Height: 5' 2.5" (1.588 m)   Body mass index is 30.24 kg/m.  Gen: resting  comfortably, no acute distress HEENT: no scleral icterus, pupils equal round and reactive, no palptable cervical adenopathy,  CV: RRR, no mrg, no jvd Resp: Clear to auscultation bilaterally GI: abdomen is soft, non-tender, non-distended, normal bowel sounds, no hepatosplenomegaly MSK: extremities are warm, no edema.  Skin: warm, no rash Neuro:  no focal deficits Psych: appropriate affect    Assessment and Plan   1.PAF/acquired thrombophilia - no symptoms - prior episode occurred in setting of severe hyperthyroidism - plan for 30 day monitor now that she is hypothyroid/euthyroid. Afib may have been isolated to hyperthyroid state. If benign monitor could d/c both dilt and eliquis       Antoine Poche, M.D.

## 2022-12-30 NOTE — Telephone Encounter (Signed)
Note provided to pt.

## 2022-12-30 NOTE — Telephone Encounter (Signed)
Patient states she left her appointment and forgot to ask for a note for work. She states the door is locked and she is unable to get back in.

## 2023-01-03 ENCOUNTER — Encounter: Payer: Self-pay | Admitting: Internal Medicine

## 2023-01-03 ENCOUNTER — Ambulatory Visit (INDEPENDENT_AMBULATORY_CARE_PROVIDER_SITE_OTHER): Payer: Medicare Other | Admitting: Internal Medicine

## 2023-01-03 VITALS — BP 138/84 | HR 62 | Ht 62.5 in | Wt 165.4 lb

## 2023-01-03 DIAGNOSIS — E782 Mixed hyperlipidemia: Secondary | ICD-10-CM

## 2023-01-03 DIAGNOSIS — Z780 Asymptomatic menopausal state: Secondary | ICD-10-CM

## 2023-01-03 DIAGNOSIS — E890 Postprocedural hypothyroidism: Secondary | ICD-10-CM

## 2023-01-03 DIAGNOSIS — I10 Essential (primary) hypertension: Secondary | ICD-10-CM

## 2023-01-03 DIAGNOSIS — I480 Paroxysmal atrial fibrillation: Secondary | ICD-10-CM | POA: Diagnosis not present

## 2023-01-03 DIAGNOSIS — Z1231 Encounter for screening mammogram for malignant neoplasm of breast: Secondary | ICD-10-CM

## 2023-01-03 DIAGNOSIS — K219 Gastro-esophageal reflux disease without esophagitis: Secondary | ICD-10-CM

## 2023-01-03 DIAGNOSIS — I700 Atherosclerosis of aorta: Secondary | ICD-10-CM

## 2023-01-03 DIAGNOSIS — M1811 Unilateral primary osteoarthritis of first carpometacarpal joint, right hand: Secondary | ICD-10-CM

## 2023-01-03 DIAGNOSIS — F411 Generalized anxiety disorder: Secondary | ICD-10-CM

## 2023-01-03 HISTORY — DX: Essential (primary) hypertension: I10

## 2023-01-03 NOTE — Assessment & Plan Note (Addendum)
Well-controlled with Celexa Has been stressed due to her husband's recent hospitalizations

## 2023-01-03 NOTE — Patient Instructions (Signed)

## 2023-01-03 NOTE — Patient Instructions (Addendum)
Please continue to take medications as prescribed.  Please continue to follow low salt diet and perform moderate exercise/walking at least 150 mins/week.  Please get Mammography and DEXA scan done as scheduled.

## 2023-01-03 NOTE — Assessment & Plan Note (Signed)
Recent episode of epigastric pain/burning and nausea likely due to acute gastritis Well-controlled with Pantoprazole Avoid hot and spicy food If persistent, may need EGD

## 2023-01-03 NOTE — Assessment & Plan Note (Signed)
Lab Results  Component Value Date   TSH 19.050 (H) 09/29/2022   On Levothyroxine 100 mcg once daily, recently increased dose Followed by Endocrinology Her weight gain was likely due to her being in hypothyroid state

## 2023-01-03 NOTE — Assessment & Plan Note (Signed)
Right thumb base pain likely due to [facility] arthritis Can apply Voltaren gel as needed Tylenol arthritis as needed for pain Advised to apply thumb splint - needs to be compliant

## 2023-01-03 NOTE — Assessment & Plan Note (Signed)
Noted on CXR Will check lipid profile later and decide about statin

## 2023-01-03 NOTE — Assessment & Plan Note (Signed)
BP Readings from Last 1 Encounters:  01/03/23 138/84   Well-controlled with Diltiazem 120 mg once daily If her cardiac monitor shows sinus rhythm, and plan to discontinue diltiazem from cardiology, she may need antihypertensive Counseled for compliance with the medications Advised DASH diet and moderate exercise/walking, at least 150 mins/week

## 2023-01-03 NOTE — Progress Notes (Signed)
Established Patient Office Visit  Subjective:  Patient ID: Krista Ramirez, female    DOB: 1955/10/31  Age: 67 y.o. MRN: [id number]  CC:  Chief Complaint  Patient presents with   Annual Exam    HPI Veralee Pages is a 67 y.o. female with past medical history of hyperthyroidism, atrial fibrillation and GAD who presents for f/u of her her chronic medical conditions.  She has had radioactive ablation X 2 of thyroid for hyperthyroidism. Her thyroid function test showed high TSH in 08/24 and her dose of levothyroxine was increased to 100 mcg QD. She denies tremors or palpitations currently.  She reports improvement in fatigue and has lost weight now.  She was started on Cardizem and Eliquis for episode of A-fib with RVR during her hospital stay, which was likely provoked by uncontrolled hyperthyroidism.  She has seen cardiologist since then.  She currently denies any chest pain, dyspnea or palpitations.  Her BP has been well controlled at home.   She reports improvement in epigastric pain and burning episodes with Pantoprazole.  She had tried OTC antacids with some relief.  She had nausea and NBNB vomiting as well.  Denies any melena or hematochezia.    Past Medical History:  Diagnosis Date   Anemia 04/27/2022   Anxiety 03/17/2021   COPD (chronic obstructive pulmonary disease) (HCC)    COPD (chronic obstructive pulmonary disease) (HCC)    Essential hypertension 01/03/2023   Gastroesophageal reflux disease 08/30/2022   Hyperthyroidism     Past Surgical History:  Procedure Laterality Date   SKIN GRAFT     TUBAL LIGATION      Family History  Problem Relation Age of Onset   Hypertension Mother     Social History   Socioeconomic History   Marital status: Married    Spouse name: Not on file   Number of children: Not on file   Years of education: Not on file   Highest education level: 12th grade  Occupational History   Not on file  Tobacco Use   Smoking status: Former     Current packs/day: 0.00    Average packs/day: 1 pack/day for 30.0 years (30.0 ttl pk-yrs)    Types: Cigarettes    Start date: 02/23/1991    Quit date: 02/22/2021    Years since quitting: 1.8   Smokeless tobacco: Never  Vaping Use   Vaping status: Never Used  Substance and Sexual Activity   Alcohol use: Never   Drug use: Never   Sexual activity: Yes    Birth control/protection: None  Other Topics Concern   Not on file  Social History Narrative   Not on file   Social Determinants of Health   Financial Resource Strain: Low Risk  (01/02/2023)   Overall Financial Resource Strain (CARDIA)    Difficulty of Paying Living Expenses: Not very hard  Food Insecurity: No Food Insecurity (01/02/2023)   Hunger Vital Sign    Worried About Running Out of Food in the Last Year: Never true    Ran Out of Food in the Last Year: Never true  Transportation Needs: No Transportation Needs (01/02/2023)   PRAPARE - Administrator, Civil Service (Medical): No    Lack of Transportation (Non-Medical): No  Physical Activity: Insufficiently Active (01/02/2023)   Exercise Vital Sign    Days of Exercise per Week: 3 days    Minutes of Exercise per Session: 30 min  Stress: No Stress Concern Present (01/02/2023)   [employer] of  Occupational Health - Occupational Stress Questionnaire    Feeling of Stress : Not at all  Social Connections: Socially Integrated (01/02/2023)   Social Connection and Isolation Panel [NHANES]    Frequency of Communication with Friends and Family: More than three times a week    Frequency of Social Gatherings with Friends and Family: Once a week    Attends Religious Services: More than 4 times per year    Active Member of [employer] or Organizations: Yes    Attends Engineer, structural: More than 4 times per year    Marital Status: Married  Catering manager Violence: Not At Risk (09/02/2021)   Humiliation, Afraid, Rape, and Kick questionnaire    Fear of Current or  Ex-Partner: No    Emotionally Abused: No    Physically Abused: No    Sexually Abused: No    Outpatient Medications Prior to Visit  Medication Sig Dispense Refill   albuterol (VENTOLIN HFA) 108 (90 Base) MCG/ACT inhaler Inhale 2 puffs into the lungs every 6 (six) hours as needed for wheezing or shortness of breath. 8 g 5   citalopram (CELEXA) 10 MG tablet Take 1 tablet (10 mg total) by mouth daily. 90 tablet 1   diltiazem (CARDIZEM CD) 120 MG 24 hr capsule Take 1 capsule (120 mg total) by mouth daily. 30 capsule 5   ELIQUIS 5 MG TABS tablet Take 1 tablet (5 mg total) by mouth 2 (two) times daily. 60 tablet 5   levothyroxine (SYNTHROID) 100 MCG tablet Take 1 tablet (100 mcg total) by mouth daily before breakfast. 90 tablet 1   pantoprazole (PROTONIX) 40 MG tablet Take 1 tablet (40 mg total) by mouth daily. 30 tablet 3   No facility-administered medications prior to visit.    No Known Allergies  ROS Review of Systems  Constitutional:  Positive for fatigue. Negative for chills and fever.  HENT:  Negative for congestion, postnasal drip, sinus pressure and sinus pain.   Eyes:  Negative for pain and discharge.  Respiratory:  Negative for cough and shortness of breath.   Cardiovascular:  Negative for chest pain and palpitations.  Gastrointestinal:  Negative for diarrhea, nausea and vomiting.  Endocrine: Negative for polydipsia and polyuria.  Genitourinary:  Negative for dysuria and hematuria.  Musculoskeletal:  Positive for arthralgias (Right thumb). Negative for neck pain and neck stiffness.  Skin:  Negative for rash.  Neurological:  Negative for dizziness and weakness.  Psychiatric/Behavioral:  Negative for agitation and behavioral problems. The patient is nervous/anxious.       Objective:    Physical Exam Vitals reviewed.  Constitutional:      General: She is not in acute distress.    Appearance: She is not diaphoretic.  HENT:     Head: Normocephalic and atraumatic.     Nose:  No congestion.     Mouth/Throat:     Mouth: Mucous membranes are moist.  Eyes:     General: No scleral icterus.    Extraocular Movements: Extraocular movements intact.  Cardiovascular:     Rate and Rhythm: Normal rate and regular rhythm.     Heart sounds: Normal heart sounds. No murmur heard. Pulmonary:     Breath sounds: Normal breath sounds. No wheezing or rales.  Abdominal:     Palpations: Abdomen is soft.     Tenderness: There is no abdominal tenderness.  Musculoskeletal:     Cervical back: Neck supple. No tenderness.     Right lower leg: No edema.  Left lower leg: No edema.  Skin:    General: Skin is warm.     Findings: No rash.  Neurological:     General: No focal deficit present.     Mental Status: She is alert and oriented to person, place, and time.     Sensory: No sensory deficit.     Motor: No weakness.  Psychiatric:        Mood and Affect: Mood normal.        Behavior: Behavior normal.     BP 138/84 (BP Location: Left Arm)   Pulse 62   Ht 5' 2.5" (1.588 m)   Wt 165 lb 6.4 oz (75 kg)   SpO2 97%   BMI 29.77 kg/m  Wt Readings from Last 3 Encounters:  01/03/23 165 lb 6.4 oz (75 kg)  12/30/22 168 lb (76.2 kg)  10/05/22 171 lb 9.6 oz (77.8 kg)    Lab Results  Component Value Date   TSH 19.050 (H) 09/29/2022   Lab Results  Component Value Date   WBC 5.0 08/23/2022   HGB 10.3 (L) 08/23/2022   HCT 32.9 (L) 08/23/2022   MCV 96 08/23/2022   PLT 235 08/23/2022   Lab Results  Component Value Date   NA 140 08/23/2022   K 4.6 08/23/2022   CO2 19 (L) 08/23/2022   GLUCOSE 85 08/23/2022   BUN 15 08/23/2022   CREATININE 1.13 (H) 08/23/2022   BILITOT 0.2 12/09/2021   ALKPHOS 233 (H) 12/09/2021   AST 17 12/09/2021   ALT 20 12/09/2021   PROT 7.0 12/09/2021   ALBUMIN 4.2 12/09/2021   CALCIUM 8.8 08/23/2022   ANIONGAP 9 03/30/2021   EGFR 53 (L) 08/23/2022   No results found for: "CHOL" No results found for: "HDL" No results found for: "LDLCALC" No  results found for: "TRIG" No results found for: "CHOLHDL" No results found for: "HGBA1C"    Assessment & Plan:   Problem List Items Addressed This Visit       Cardiovascular and Mediastinum   Atrial fibrillation (HCC)    Had A-fib with RVR during hospital visit, likely due to uncontrolled hyperthyroidism in the past Currently in sinus rhythm Currently on Diltiazem and Eliquis - continue for now, refills provided Followed by cardiology - planned to get cardiac monitor and if persistent sinus rhythm, plan to DC Diltiazem and Eliquis      Relevant Orders   CMP14+EGFR   Aortic atherosclerosis (HCC)    Noted on CXR Will check lipid profile later and decide about statin      Essential hypertension    BP Readings from Last 1 Encounters:  01/03/23 138/84   Well-controlled with Diltiazem 120 mg once daily If her cardiac monitor shows sinus rhythm, and plan to discontinue diltiazem from cardiology, she may need antihypertensive Counseled for compliance with the medications Advised DASH diet and moderate exercise/walking, at least 150 mins/week         Digestive   Gastroesophageal reflux disease    Recent episode of epigastric pain/burning and nausea likely due to acute gastritis Well-controlled with Pantoprazole Avoid hot and spicy food If persistent, may need EGD        Endocrine   Postablative hypothyroidism - Primary    Lab Results  Component Value Date   TSH 19.050 (H) 09/29/2022   On Levothyroxine 100 mcg once daily, recently increased dose Followed by Endocrinology Her weight gain was likely due to her being in hypothyroid state  Musculoskeletal and Integument   Arthritis of carpometacarpal (CMC) joint of right thumb    Right thumb base pain likely due to [facility] arthritis Can apply Voltaren gel as needed Tylenol arthritis as needed for pain Advised to apply thumb splint - needs to be compliant        Other   GAD (generalized anxiety disorder)     Well-controlled with Celexa Has been stressed due to her husband's recent hospitalizations      Other Visit Diagnoses     Mixed hyperlipidemia       Relevant Orders   Lipid Profile   Postmenopausal       Relevant Orders   DG Bone Density   Encounter for screening mammogram for malignant neoplasm of breast       Relevant Orders   MM 3D SCREENING MAMMOGRAM BILATERAL BREAST       No orders of the defined types were placed in this encounter.   Follow-up: Return in about 4 months (around 05/03/2023).    Anabel Halon, MD

## 2023-01-03 NOTE — Assessment & Plan Note (Signed)
Had A-fib with RVR during hospital visit, likely due to uncontrolled hyperthyroidism in the past Currently in sinus rhythm Currently on Diltiazem and Eliquis - continue for now, refills provided Followed by cardiology - planned to get cardiac monitor and if persistent sinus rhythm, plan to DC Diltiazem and Eliquis

## 2023-01-04 LAB — T4, FREE: Free T4: 1.18 ng/dL (ref 0.82–1.77)

## 2023-01-04 LAB — TSH: TSH: 7.12 u[IU]/mL — ABNORMAL HIGH (ref 0.450–4.500)

## 2023-01-05 ENCOUNTER — Telehealth: Payer: Self-pay

## 2023-01-05 ENCOUNTER — Telehealth: Payer: Self-pay | Admitting: *Deleted

## 2023-01-05 ENCOUNTER — Encounter: Payer: Self-pay | Admitting: Nurse Practitioner

## 2023-01-05 ENCOUNTER — Ambulatory Visit (INDEPENDENT_AMBULATORY_CARE_PROVIDER_SITE_OTHER): Payer: Medicare Other | Admitting: Nurse Practitioner

## 2023-01-05 VITALS — BP 138/76 | HR 56 | Ht 62.5 in | Wt 167.8 lb

## 2023-01-05 DIAGNOSIS — E890 Postprocedural hypothyroidism: Secondary | ICD-10-CM

## 2023-01-05 MED ORDER — LEVOTHYROXINE SODIUM 112 MCG PO TABS: 112.0000 ug | ORAL_TABLET | Freq: Every day | ORAL | 1 refills | Status: DC

## 2023-01-05 NOTE — Telephone Encounter (Signed)
Tracking provided by [employer] shows montior should be delivered by 01/05/23 by 9 pm. Pt aware.

## 2023-01-05 NOTE — Telephone Encounter (Signed)
patient said she was supposed to receive her heart monitor yesterday but it never came in

## 2023-01-05 NOTE — Progress Notes (Signed)
01/05/2023     Endocrinology Follow Up Note     Subjective:    Patient ID: Krista Ramirez, female    DOB: 04/08/1955, PCP Krista Halon, MD.   Past Medical History:  Diagnosis Date   Anemia 04/27/2022   Anxiety 03/17/2021   COPD (chronic obstructive pulmonary disease) (HCC)    COPD (chronic obstructive pulmonary disease) (HCC)    Essential hypertension 01/03/2023   Gastroesophageal reflux disease 08/30/2022   Hyperthyroidism     Past Surgical History:  Procedure Laterality Date   SKIN GRAFT     TUBAL LIGATION      Social History   Socioeconomic History   Marital status: Married    Spouse name: Not on file   Number of children: Not on file   Years of education: Not on file   Highest education level: 12th grade  Occupational History   Not on file  Tobacco Use   Smoking status: Former    Current packs/day: 0.00    Average packs/day: 1 pack/day for 30.0 years (30.0 ttl pk-yrs)    Types: Cigarettes    Start date: 02/23/1991    Quit date: 02/22/2021    Years since quitting: 1.8   Smokeless tobacco: Never  Vaping Use   Vaping status: Never Used  Substance and Sexual Activity   Alcohol use: Never   Drug use: Never   Sexual activity: Yes    Birth control/protection: None  Other Topics Concern   Not on file  Social History Narrative   Not on file   Social Determinants of Health   Financial Resource Strain: Low Risk  (01/02/2023)   Overall Financial Resource Strain (CARDIA)    Difficulty of Paying Living Expenses: Not very hard  Food Insecurity: No Food Insecurity (01/02/2023)   Hunger Vital Sign    Worried About Running Out of Food in the Last Year: Never true    Ran Out of Food in the Last Year: Never true  Transportation Needs: No Transportation Needs (01/02/2023)   PRAPARE - Administrator, Civil Service (Medical): No    Lack of Transportation (Non-Medical): No  Physical Activity: Insufficiently Active (01/02/2023)   Exercise Vital  Sign    Days of Exercise per Week: 3 days    Minutes of Exercise per Session: 30 min  Stress: No Stress Concern Present (01/02/2023)   Krista Ramirez of Occupational Health - Occupational Stress Questionnaire    Feeling of Stress : Not at all  Social Connections: Socially Integrated (01/02/2023)   Social Connection and Isolation Panel [NHANES]    Frequency of Communication with Friends and Family: More than three times a week    Frequency of Social Gatherings with Friends and Family: Once a week    Attends Religious Services: More than 4 times per year    Active Member of [employer] or Organizations: Yes    Attends Engineer, structural: More than 4 times per year    Marital Status: Married    Family History  Problem Relation Age of Onset   Hypertension Mother     Outpatient Encounter Medications as of 01/05/2023  Medication Sig   albuterol (VENTOLIN HFA) 108 (90 Base) MCG/ACT inhaler Inhale 2 puffs into the lungs every 6 (six) hours as needed for wheezing or shortness of breath.   citalopram (CELEXA) 10 MG tablet Take 1 tablet (10 mg total) by mouth daily.   diltiazem (CARDIZEM CD) 120 MG 24 hr capsule Take 1 capsule (  120 mg total) by mouth daily.   ELIQUIS 5 MG TABS tablet Take 1 tablet (5 mg total) by mouth 2 (two) times daily.   pantoprazole (PROTONIX) 40 MG tablet Take 1 tablet (40 mg total) by mouth daily.   [DISCONTINUED] levothyroxine (SYNTHROID) 100 MCG tablet Take 1 tablet (100 mcg total) by mouth daily before breakfast.   levothyroxine (SYNTHROID) 112 MCG tablet Take 1 tablet (112 mcg total) by mouth daily before breakfast.   No facility-administered encounter medications on file as of 01/05/2023.    ALLERGIES: No Known Allergies  VACCINATION STATUS: Immunization History  Administered Date(s) Administered   Fluad Quad(high Dose 65+) 12/06/2021   Influenza-Unspecified 12/03/2020, 12/15/2022   PNEUMOCOCCAL CONJUGATE-20 12/20/2021   Pfizer Covid-19 Vaccine  Bivalent Booster 22yrs & up 02/17/2019, 03/10/2019, 11/04/2019   Zoster Recombinant(Shingrix) 12/20/2021, 02/25/2022     HPI  Krista Ramirez is 67 y.o. female who presents today with a medical history as above. she is being seen in follow up after being seen in consultation for hyperthyroidism requested by Krista Halon, MD.  she has been dealing with symptoms of anxiety, insomnia, irritability, palpitations, weight loss, and tremors for 6-8 months. These symptoms are progressively worsening and troubling to her.  her most recent thyroid labs revealed suppressed TSH of < 0.010, high Free T3 of 26.5 and high Free T4 of 4.79 on 03/29/21. she denies dysphagia, choking, shortness of breath, no recent voice change.    she does have family history of thyroid dysfunction in her siblings, but denies family hx of thyroid cancer. she denies personal history of goiter. she is currently on Methimazole 10 mg po twice daily. Denies use of Biotin containing supplements.  she is willing to proceed with appropriate work up and therapy for thyrotoxicosis.   Review of systems  Constitutional: + steadily decreasing body weight,  current Body mass index is 30.2 kg/m. , no fatigue, no subjective hyperthermia, no subjective hypothermia Eyes: no blurry vision, no xerophthalmia ENT: no sore throat, no nodules palpated in throat, no dysphagia/odynophagia, no hoarseness Cardiovascular: no chest pain, no shortness of breath, no palpitations, no leg swelling Respiratory: no cough, no shortness of breath Gastrointestinal: no nausea/vomiting, diarrhea Musculoskeletal: no muscle/joint aches Skin: no rashes, no hyperemia Neurological: no tremors, no numbness, no tingling, no dizziness Psychiatric: no depression, + anxiety r/t stress from husbands health issues   Objective:    BP 138/76 (BP Location: Right Arm, Patient Position: Sitting, Cuff Size: Large)   Pulse (!) 56   Ht 5' 2.5" (1.588 m)   Wt 167 lb 12.8 oz  (76.1 kg)   BMI 30.20 kg/m   Wt Readings from Last 3 Encounters:  01/05/23 167 lb 12.8 oz (76.1 kg)  01/03/23 165 lb 6.4 oz (75 kg)  12/30/22 168 lb (76.2 kg)     BP Readings from Last 3 Encounters:  01/05/23 138/76  01/03/23 138/84  12/30/22 122/68     Physical Exam- Limited  Constitutional:  Body mass index is 30.2 kg/m. , not in acute distress, normal state of mind Eyes:  EOMI, no exophthalmos Musculoskeletal: no gross deformities, strength intact in all four extremities, no gross restriction of joint movements Skin:  no rashes, no hyperemia Neurological: no tremor with outstretched hands   CMP     Component Value Date/Time   NA 140 08/23/2022 1017   K 4.6 08/23/2022 1017   CL 106 08/23/2022 1017   CO2 19 (L) 08/23/2022 1017   GLUCOSE 85 08/23/2022 1017  GLUCOSE 105 (H) 03/30/2021 0438   BUN 15 08/23/2022 1017   CREATININE 1.13 (H) 08/23/2022 1017   CALCIUM 8.8 08/23/2022 1017   PROT 7.0 12/09/2021 1600   ALBUMIN 4.2 12/09/2021 1600   AST 17 12/09/2021 1600   ALT 20 12/09/2021 1600   ALKPHOS 233 (H) 12/09/2021 1600   BILITOT 0.2 12/09/2021 1600   GFRNONAA >60 03/30/2021 0438     CBC    Component Value Date/Time   WBC 5.0 08/23/2022 1017   WBC 6.4 04/28/2021 0806   RBC 3.42 (L) 08/23/2022 1017   RBC 3.79 (L) 04/28/2021 0806   HGB 10.3 (L) 08/23/2022 1017   HCT 32.9 (L) 08/23/2022 1017   PLT 235 08/23/2022 1017   MCV 96 08/23/2022 1017   MCH 30.1 08/23/2022 1017   MCH 26.6 04/28/2021 0806   MCHC 31.3 (L) 08/23/2022 1017   MCHC 30.2 04/28/2021 0806   RDW 15.5 (H) 08/23/2022 1017   LYMPHSABS 1.5 08/23/2022 1017   EOSABS 0.1 08/23/2022 1017   BASOSABS 0.0 08/23/2022 1017     Diabetic Labs (most recent): No results found for: "HGBA1C", "MICROALBUR"  Lipid Panel  No results found for: "CHOL", "TRIG", "HDL", "CHOLHDL", "VLDL", "LDLCALC", "LDLDIRECT", "LABVLDL"   Lab Results  Component Value Date   TSH 7.120 (H) 01/03/2023   TSH 19.050 (H)  09/29/2022   TSH 42.600 (H) 06/24/2022   TSH <0.005 (L) 03/03/2022   TSH <0.005 (L) 12/30/2021   TSH <0.005 (L) 10/27/2021   TSH <0.005 (L) 08/25/2021   TSH 0.108 (L) 06/28/2021   TSH <0.005 (L) 04/20/2021   TSH <0.010 (L) 03/29/2021   FREET4 1.18 01/03/2023   FREET4 0.72 09/29/2022   FREET4 0.10 (L) 06/24/2022   FREET4 1.62 03/03/2022   FREET4 2.10 (H) 12/30/2021   FREET4 2.12 (H) 10/27/2021   FREET4 1.66 08/25/2021   FREET4 0.38 (L) 06/28/2021   FREET4 0.75 (L) 04/20/2021   FREET4 4.79 (H) 03/29/2021       Uptake and Scan from 04/29/21 CLINICAL DATA:  Hyperthyroidism. TSH equal 0.005. Nervousness. And increased appetite. Difficulty sleeping. Hair loss. Heart palpitations.   EXAM: THYROID SCAN AND UPTAKE - 4 AND 24 HOURS   TECHNIQUE: Following oral administration of I-123 capsule, anterior planar imaging was acquired at 24 hours. Thyroid uptake was calculated with a thyroid probe at 4-6 hours and 24 hours.   RADIOPHARMACEUTICALS:  Three hundred uCi I-123 sodium iodide p.o.   COMPARISON:  None.   FINDINGS: Uniform uptake within thyroid gland.  No nodularity.   4 hour I-123 uptake = 71.8% (normal 5-20%)   24 hour I-123 uptake = 59.4% (normal 10-30%)   IMPRESSION: 1. Thyroid imaging and I 123 uptake consistent with Graves disease. 2. Rapid turnover of I 123 at 4 hour uptake.     Electronically Signed   By: Genevive Bi M.D.   On: 04/29/2021 16:05   Latest Reference Range & Units 12/30/21 16:25 03/03/22 10:46 06/24/22 09:27 09/29/22 11:35 09/29/22 11:36 01/03/23 15:06  TSH 0.450 - 4.500 uIU/mL <0.005 (L) <0.005 (L) 42.600 (H) 19.050 (H)  7.120 (H)  Triiodothyronine,Free,Serum 2.0 - 4.4 pg/mL  5.7 (H) <0.3 (L)     T4,Free(Direct) 0.82 - 1.77 ng/dL 1.61 (H) 0.96 0.45 (L)  0.72 1.18  (L): Data is abnormally low (H): Data is abnormally high Assessment & Plan:   1) Hypothyroidism- S/p RAI ablation for Graves Disease  she is being seen at a kind request of  Krista Halon, MD.  She  is S/P RAI ablation on 05/07/21 and once again on 04/08/22.   Her previsit TFTs are consistent with slight under-replacement.  She is advised to increase her Levothyroxine to 112 mcg po daily before breakfast.  Will recheck TFTs prior to next visit and adjust dose accordingly.   - The correct intake of thyroid hormone (Levothyroxine, Synthroid), is on empty stomach first thing in the morning, with water, separated by at least 30 minutes from breakfast and other medications,  and separated by more than 4 hours from calcium, iron, multivitamins, acid reflux medications (PPIs).  - This medication is a life-long medication and will be needed to correct thyroid hormone imbalances for the rest of your life.  The dose may change from time to time, based on thyroid blood work.  - It is extremely important to be consistent taking this medication, near the same time each morning.  -AVOID TAKING PRODUCTS CONTAINING BIOTIN (commonly found in Hair, Skin, Nails vitamins) AS IT INTERFERES WITH THE VALIDITY OF THYROID FUNCTION BLOOD TESTS.       -Patient is advised to maintain close follow up with Krista Halon, MD for primary care needs.    I spent  17  minutes in the care of the patient today including review of labs from Thyroid Function, CMP, and other relevant labs ; imaging/biopsy records (current and previous including abstractions from other facilities); face-to-face time discussing  her lab results and symptoms, medications doses, her options of short and long term treatment based on the latest standards of care / guidelines;   and documenting the encounter.  Krista Ramirez  participated in the discussions, expressed understanding, and voiced agreement with the above plans.  All questions were answered to her satisfaction. she is encouraged to contact clinic should she have any questions or concerns prior to her return visit.  Follow up plan: Return in about 4 months  (around 05/05/2023) for Thyroid follow up, Previsit labs.   Thank you for involving me in the care of this pleasant patient, and I will continue to update you with her progress.   Ronny Bacon, [facility] [address] [place], [place] [address] Phone: [phone] Fax: [phone]  01/05/2023, 3:07 PM

## 2023-01-07 DIAGNOSIS — I480 Paroxysmal atrial fibrillation: Secondary | ICD-10-CM | POA: Diagnosis not present

## 2023-01-09 ENCOUNTER — Ambulatory Visit: Payer: Medicare Other | Attending: Cardiology

## 2023-01-09 DIAGNOSIS — I480 Paroxysmal atrial fibrillation: Secondary | ICD-10-CM

## 2023-01-13 ENCOUNTER — Other Ambulatory Visit: Payer: Self-pay

## 2023-01-13 ENCOUNTER — Encounter (HOSPITAL_COMMUNITY): Payer: Self-pay

## 2023-01-13 ENCOUNTER — Emergency Department (HOSPITAL_COMMUNITY)
Admission: EM | Admit: 2023-01-13 | Discharge: 2023-01-13 | Disposition: A | Discharge status: Home / Self Care | Payer: Medicare Other

## 2023-01-13 DIAGNOSIS — M79674 Pain in right toe(s): Secondary | ICD-10-CM | POA: Diagnosis present

## 2023-01-13 DIAGNOSIS — I10 Essential (primary) hypertension: Secondary | ICD-10-CM | POA: Insufficient documentation

## 2023-01-13 DIAGNOSIS — Z79899 Other long term (current) drug therapy: Secondary | ICD-10-CM | POA: Diagnosis not present

## 2023-01-13 DIAGNOSIS — L600 Ingrowing nail: Secondary | ICD-10-CM | POA: Insufficient documentation

## 2023-01-13 DIAGNOSIS — J449 Chronic obstructive pulmonary disease, unspecified: Secondary | ICD-10-CM | POA: Diagnosis not present

## 2023-01-13 MED ORDER — METHOCARBAMOL 500 MG PO TABS: 500.0000 mg | ORAL_TABLET | Freq: Two times a day (BID) | ORAL | 0 refills | Status: AC

## 2023-01-13 MED ORDER — IBUPROFEN 800 MG PO TABS
800.0000 mg | ORAL_TABLET | Freq: Once | ORAL | Status: AC
  Administered 2023-01-13: 800 mg via ORAL
  Filled 2023-01-13: qty 1

## 2023-01-13 NOTE — Progress Notes (Signed)
Orthopedic Tech Progress Note Patient Details:  Krista Ramirez May 09, 1955 [id number] Post-op shoe was applied to her right foot.  Ortho Devices Type of Ortho Device: Postop shoe/boot Ortho Device/Splint Location: LLE Ortho Device/Splint Interventions: Application   Post Interventions Patient Tolerated: Well  Krista Ramirez 01/13/2023, 7:39 PM

## 2023-01-13 NOTE — ED Provider Notes (Signed)
[place] EMERGENCY DEPARTMENT AT [facility] Provider Note   CSN: [id number] Arrival date & time: 01/13/23  1732     History  Chief Complaint  Patient presents with   Toe Pain    Krista Ramirez is a 67 y.o. female.  With history of COPD, GERD, anxiety, anemia, hypertension, hyperthyroidism presenting to the emergency department for evaluation of right great toe pain.  This pain began approximately 2 weeks ago.  She states that 2 years ago she developed an injury to her right great toe and at the toenail grew back abnormally.  She has had intermittent issues with this toe since then.  Pain has become significantly worse over the past day.  She reports worsened pain with ambulation.  States the toe is slightly more swollen on the medial aspect than normal.  She denies any purulent drainage.  No pain to any other portion of the foot.  No recent trauma.   Toe Pain       Home Medications Prior to Admission medications   Medication Sig Start Date End Date Taking? Authorizing Provider  methocarbamol (ROBAXIN) 500 MG tablet Take 1 tablet (500 mg total) by mouth 2 (two) times daily for 7 days. 01/13/23 01/20/23 Yes Jo-Ann Johanning, Edsel Petrin, PA-C  albuterol (VENTOLIN HFA) 108 (90 Base) MCG/ACT inhaler Inhale 2 puffs into the lungs every 6 (six) hours as needed for wheezing or shortness of breath. 04/08/21   Anabel Halon, MD  citalopram (CELEXA) 10 MG tablet Take 1 tablet (10 mg total) by mouth daily. 10/18/22   Anabel Halon, MD  diltiazem (CARDIZEM CD) 120 MG 24 hr capsule Take 1 capsule (120 mg total) by mouth daily. 11/16/22   Anabel Halon, MD  ELIQUIS 5 MG TABS tablet Take 1 tablet (5 mg total) by mouth 2 (two) times daily. 10/18/22   Anabel Halon, MD  levothyroxine (SYNTHROID) 112 MCG tablet Take 1 tablet (112 mcg total) by mouth daily before breakfast. 01/05/23   Dani Gobble, NP  pantoprazole (PROTONIX) 40 MG tablet Take 1 tablet (40 mg total) by mouth daily. 10/12/22    Anabel Halon, MD      Allergies    Patient has no known allergies.    Review of Systems   Review of Systems  Musculoskeletal:  Positive for arthralgias.  All other systems reviewed and are negative.   Physical Exam Updated Vital Signs BP (!) 155/99 (BP Location: Left Arm)   Pulse 75   Temp 98.1 F (36.7 C) (Oral)   Resp 18   Ht 5\' 2"  (1.575 m)   Wt 75.3 kg   SpO2 95%   BMI 30.36 kg/m  Physical Exam Vitals and nursing note reviewed.  Constitutional:      General: She is not in acute distress.    Appearance: Normal appearance. She is normal weight. She is not ill-appearing.  HENT:     Head: Normocephalic and atraumatic.  Pulmonary:     Effort: Pulmonary effort is normal. No respiratory distress.  Abdominal:     General: Abdomen is flat.  Musculoskeletal:        General: Normal range of motion.     Cervical back: Neck supple.  Feet:     Comments: Right great toe with ingrown and thick toenail, significant curvature to the medial aspect.  Mild surrounding swelling.  No erythema.  No warmth to the touch.  Normal capillary refill.  Exquisite TTP.  No purulent drainage Skin:  General: Skin is warm and dry.  Neurological:     Mental Status: She is alert and oriented to person, place, and time.  Psychiatric:        Mood and Affect: Mood normal.        Behavior: Behavior normal.     ED Results / Procedures / Treatments   Labs (all labs ordered are listed, but only abnormal results are displayed) Labs Reviewed - No data to display  EKG None  Radiology No results found.  Procedures Procedures    Medications Ordered in ED Medications  ibuprofen (ADVIL) tablet 800 mg (has no administration in time range)    ED Course/ Medical Decision Making/ A&P                                 Medical Decision Making This patient presents to the ED for concern of toe pain, this involves an extensive number of treatment options, and is a complaint that carries with  it a high risk of complications and morbidity.  The differential diagnosis includes paronychia, onychomycosis, ingrown nail  Additional history obtained from: Nursing notes from this visit.  Afebrile, hemodynamically stable.  67 year old female presenting for evaluation of atraumatic right great toe pain.  Believes this is due to an ingrown toenail.  No signs or symptoms of infection.  On exam, the right great toe has an irregularly shaped and thickened nail with increased curvature.  Suspect this is the cause of her symptoms.  She was informed that she would be better suited seeing an orthopedic surgeon regarding partial nail removal.  She was given a postop shoe for comfort.  She has not taken anything for her pain.  She is anticoagulated on Eliquis for PAF.  She was encouraged to avoid frequent NSAID use.  She was encouraged to take Tylenol for her symptoms.  She was given contact information for orthopedics and encouraged to follow-up.  She was given return precautions.  Stable at discharge.  At this time there does not appear to be any evidence of an acute emergency medical condition and the patient appears stable for discharge with appropriate outpatient follow up. Diagnosis was discussed with patient who verbalizes understanding of care plan and is agreeable to discharge. I have discussed return precautions with patient and husband at bedside who verbalizes understanding. Patient encouraged to follow-up with orthopedics as soon as possible. All questions answered.  Note: Portions of this report may have been transcribed using voice recognition software. Every effort was made to ensure accuracy; however, inadvertent computerized transcription errors may still be present.        Final Clinical Impression(s) / ED Diagnoses Final diagnoses:  Ingrown right big toenail    Rx / DC Orders ED Discharge Orders          Ordered    methocarbamol (ROBAXIN) 500 MG tablet  2 times daily         01/13/23 1908              Michelle Piper, Cordelia Poche 01/13/23 1910    Coral Spikes, DO 01/13/23 2248

## 2023-01-13 NOTE — ED Triage Notes (Signed)
Pt presenting today complaining of pain in the right big toe. Nail appears to be ingrown. Pt states she has this issue often.

## 2023-01-13 NOTE — Discharge Instructions (Addendum)
You have been seen today for your complaint of right great toe pain. Your discharge medications include Tylenol.  You may take up to 650 mg of Tylenol every 6 hours in addition to this medication.  Pain.  Avoid frequent use of NSAIDs due to your blood thinner use. Robaxin. This is a muscle relaxer. It may cause drowsiness. Do not drive, operate heavy machinery or make important decisions when taking this medication. Only take it at night until you know how it affects you. Only take it as needed and take other medications such as tylenol prior to trying this medication.   Home care instructions are as follows:  Wear the postop shoe when walking.  Use warm Epsom salt soaks daily Follow up with: Dr. Hulda Humphrey.  He is an orthopedic surgeon.  Call to schedule a follow-up appointment Please seek immediate medical care if you develop any of the following symptoms: You have more redness, swelling, pain, or other symptoms that do not improve with treatment. You have fluid, blood, or pus coming from your toenail. You have a red streak on your skin that starts at your foot and spreads up your leg. You have a fever. At this time there does not appear to be the presence of an emergent medical condition, however there is always the potential for conditions to change. Please read and follow the below instructions.  Do not take your medicine if  develop an itchy rash, swelling in your mouth or lips, or difficulty breathing; call 911 and seek immediate emergency medical attention if this occurs.  You may review your lab tests and imaging results in their entirety on your MyChart account.  Please discuss all results of fully with your primary care provider and other specialist at your follow-up visit.  Note: Portions of this text may have been transcribed using voice recognition software. Every effort was made to ensure accuracy; however, inadvertent computerized transcription errors may still be present.

## 2023-02-20 ENCOUNTER — Other Ambulatory Visit: Payer: Self-pay | Admitting: Internal Medicine

## 2023-02-20 DIAGNOSIS — K219 Gastro-esophageal reflux disease without esophagitis: Secondary | ICD-10-CM

## 2023-03-07 ENCOUNTER — Telehealth: Payer: Self-pay | Admitting: Cardiology

## 2023-03-07 NOTE — Telephone Encounter (Signed)
Krista Poche, MD 02/28/2023  3:39 PM EST     Normal heart monitor, if doing well at next f/u could consider coming off some medications since we have not seen recurrent afib. Move her f/u to March please with me or PA   Dominga Ferry MD

## 2023-03-07 NOTE — Telephone Encounter (Signed)
Attempted to contact pt- no answer, mailbox full

## 2023-03-07 NOTE — Telephone Encounter (Signed)
Patient is returning call in regards to CARDIAC TELEMETRY MONITORING results. Please advise.

## 2023-03-08 NOTE — Telephone Encounter (Signed)
Phone does not ring,goes direct to a message that states no VM has been set yp for this phone.

## 2023-03-08 NOTE — Telephone Encounter (Signed)
Results discussed with patient,will f/u in march

## 2023-03-15 ENCOUNTER — Encounter (HOSPITAL_COMMUNITY): Payer: Self-pay

## 2023-03-15 ENCOUNTER — Ambulatory Visit (HOSPITAL_COMMUNITY)
Admission: RE | Admit: 2023-03-15 | Discharge: 2023-03-15 | Disposition: A | Discharge status: Home / Self Care | Payer: Medicare Other | Source: Ambulatory Visit | Attending: Internal Medicine | Admitting: Internal Medicine

## 2023-03-15 DIAGNOSIS — Z780 Asymptomatic menopausal state: Secondary | ICD-10-CM | POA: Insufficient documentation

## 2023-03-15 DIAGNOSIS — Z1231 Encounter for screening mammogram for malignant neoplasm of breast: Secondary | ICD-10-CM | POA: Diagnosis present

## 2023-03-16 ENCOUNTER — Encounter: Payer: Self-pay | Admitting: Internal Medicine

## 2023-03-16 ENCOUNTER — Other Ambulatory Visit: Payer: Self-pay | Admitting: Internal Medicine

## 2023-03-16 DIAGNOSIS — M810 Age-related osteoporosis without current pathological fracture: Secondary | ICD-10-CM | POA: Insufficient documentation

## 2023-03-16 HISTORY — DX: Age-related osteoporosis without current pathological fracture: M81.0

## 2023-03-16 MED ORDER — ALENDRONATE SODIUM 70 MG PO TABS: 70.0000 mg | ORAL_TABLET | ORAL | 3 refills | Status: DC

## 2023-04-07 ENCOUNTER — Other Ambulatory Visit: Payer: Self-pay | Admitting: Nurse Practitioner

## 2023-04-07 DIAGNOSIS — E890 Postprocedural hypothyroidism: Secondary | ICD-10-CM

## 2023-04-09 IMAGING — DX DG CHEST 1V PORT
1 series · 1 of 1 positions shown · non-contrast
Comparison: Portable exam 2705 hours without priors for comparison

CLINICAL DATA: Shortness of breath, new onset atrial fibrillation

EXAM:
PORTABLE CHEST 1 VIEW

[chest ap]
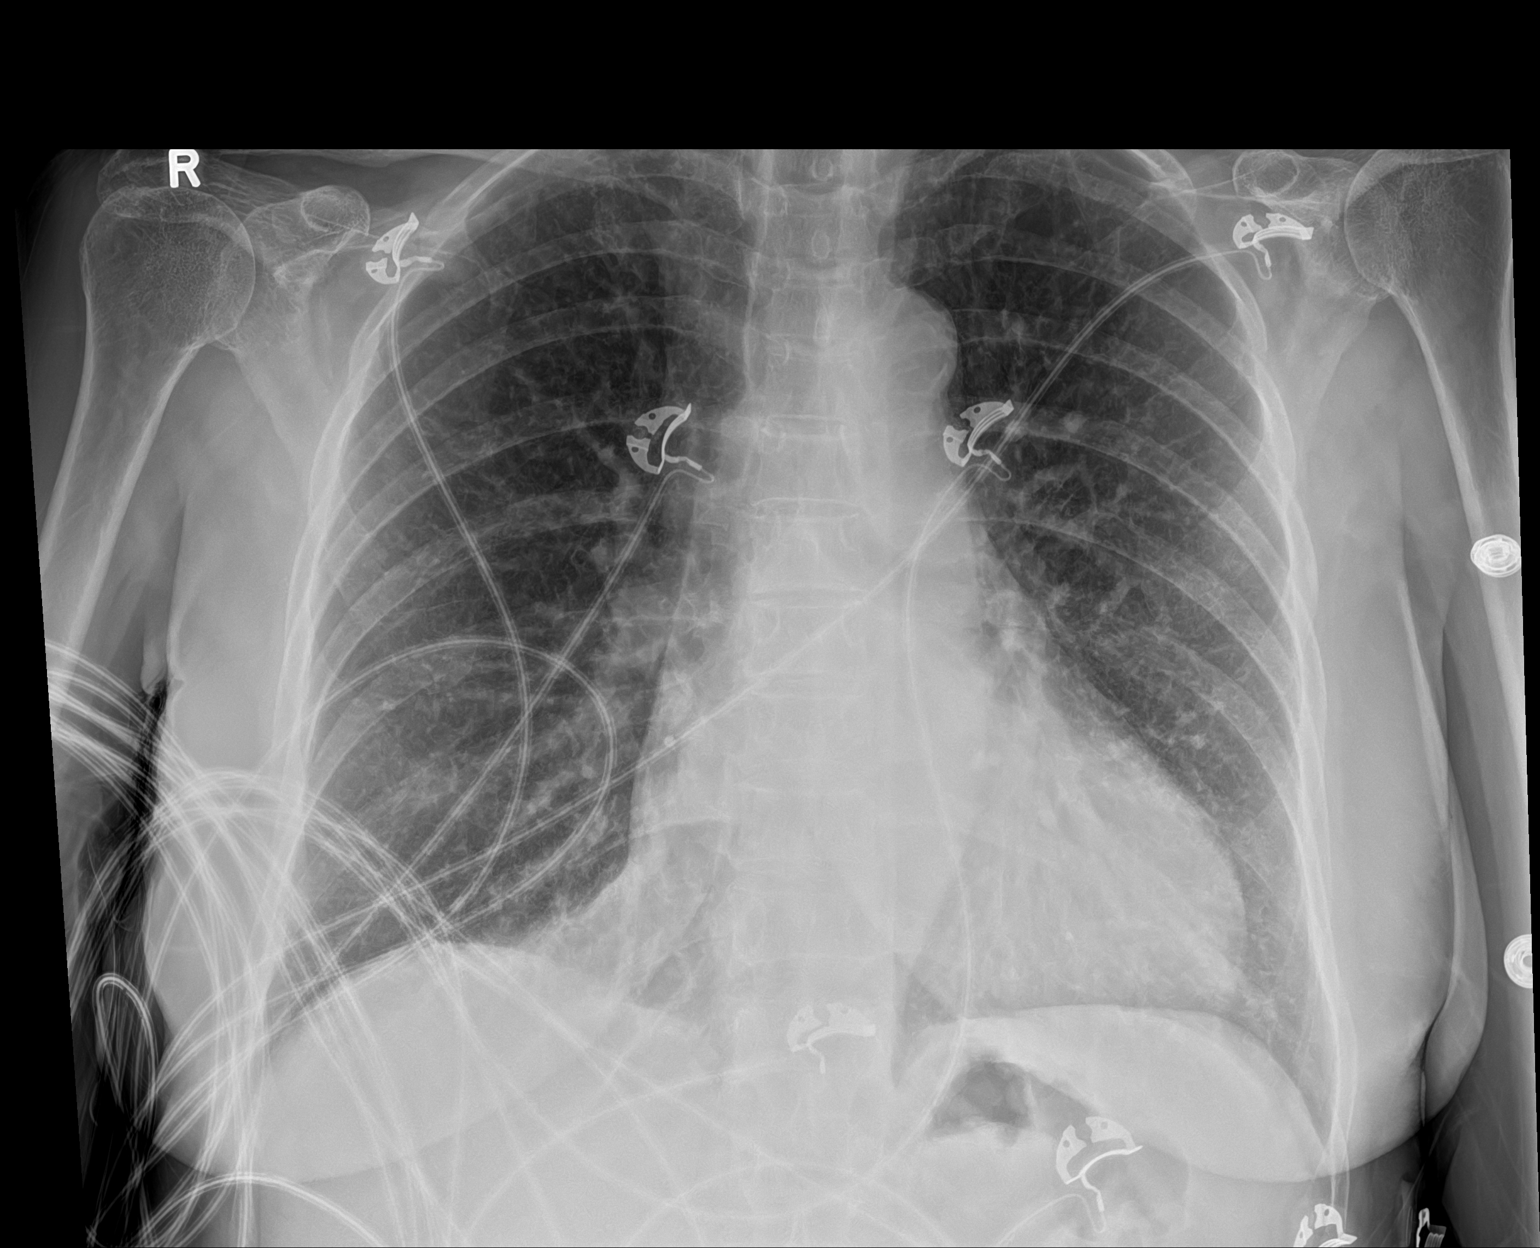

[1 of 1 positions shown; findings below may reference images not displayed]

FINDINGS: Upper normal heart size.

Mediastinal contours and pulmonary vascularity normal.

Atherosclerotic calcification aorta.

Lungs clear.

No infiltrate, pleural effusion, or pneumothorax.

Osseous structures unremarkable.
IMPRESSION: No acute abnormalities.

Aortic Atherosclerosis ([id number]).

## 2023-04-21 ENCOUNTER — Other Ambulatory Visit: Payer: Self-pay | Admitting: Nurse Practitioner

## 2023-04-21 ENCOUNTER — Other Ambulatory Visit: Payer: Self-pay | Admitting: *Deleted

## 2023-04-21 DIAGNOSIS — E890 Postprocedural hypothyroidism: Secondary | ICD-10-CM

## 2023-04-21 MED ORDER — LEVOTHYROXINE SODIUM 112 MCG PO TABS: 112.0000 ug | ORAL_TABLET | Freq: Every day | ORAL | 1 refills | Status: DC

## 2023-05-03 ENCOUNTER — Ambulatory Visit: Payer: Medicare Other | Admitting: Internal Medicine

## 2023-05-03 NOTE — Progress Notes (Deleted)
 Cardiology Office Note    Date:  05/03/2023  ID:  Krista Ramirez, DOB 1955/12/01, MRN [id number] Cardiologist: Dina Rich, MD    History of Present Illness:    Krista Ramirez is a 68 y.o. female with past medical history of paroxysmal atrial fibrillation (occurred in 03/2021 in the setting of hyperthyroidism), COPD and hyperthyroidism (s/p radioactive iodine ablation in 04/2021) who presents to the office today for 50-month follow-up.  She was examined by Dr. Wyline Mood in 12/2022 and denied any recent palpitations at that time.  It was recommended to obtain a 30-day monitor now that she was hypothyroid/euthyroid and if benign monitor, would discontinue Cardizem and Eliquis. Her monitor showed predominantly normal sinus rhythm with an average heart rate of 63 bpm. She did have rare PAC's and PVC's but no significant arrhythmias. Was recommended to reassess at follow-up and could consider coming off medications since no recurrent atrial fibrillation.  ROS: ***  Studies Reviewed:   EKG: EKG is*** ordered today and demonstrates ***   EKG Interpretation Date/Time:    Ventricular Rate:    PR Interval:    QRS Duration:    QT Interval:    QTC Calculation:   R Axis:      Text Interpretation:         Echocardiogram: 03/2021 IMPRESSIONS     1. Left ventricular ejection fraction, by estimation, is 60 to 65%. The  left ventricle has normal function. The left ventricle has no regional  wall motion abnormalities. Left ventricular diastolic parameters are  indeterminate.   2. Right ventricular systolic function is normal. The right ventricular  size is normal. There is normal pulmonary artery systolic pressure.   3. Left atrial size was mildly dilated.   4. The mitral valve is normal in structure. Mild mitral valve  regurgitation. No evidence of mitral stenosis.   5. The aortic valve is tricuspid. Aortic valve regurgitation is mild. No  aortic stenosis is present.   6. The inferior  vena cava is normal in size with greater than 50%  respiratory variability, suggesting right atrial pressure of 3 mmHg.   Event Monitor: 12/2022   30 day monitor, data available only from 75% of planned monitored time   Min HR 41, Max HR 148, Avg HR 63   Rare supraventricular ectopy   Rare ventricular ectopy   Telemetry tracings show sinus rhythm with PACs   No significant arrhythmias  Risk Assessment/Calculations:   {Does this patient have ATRIAL FIBRILLATION?:[phone]} No BP recorded.  {Refresh Note OR Click here to enter BP  :1}***         Physical Exam:   VS:  There were no vitals taken for this visit.   Wt Readings from Last 3 Encounters:  01/13/23 166 lb (75.3 kg)  01/05/23 167 lb 12.8 oz (76.1 kg)  01/03/23 165 lb 6.4 oz (75 kg)     GEN: Well nourished, well developed in no acute distress NECK: No JVD; No carotid bruits CARDIAC: ***RRR, no murmurs, rubs, gallops RESPIRATORY:  Clear to auscultation without rales, wheezing or rhonchi  ABDOMEN: Appears non-distended. No obvious abdominal masses. EXTREMITIES: No clubbing or cyanosis. No edema.  Distal pedal pulses are 2+ bilaterally.   Assessment and Plan:   1. Paroxysmal Atrial Fibrillation - ***  2. Hyperthyroidism - She previously underwent ablation and has recently been hypothyroid and on replacement with Synthroid 112 mcg daily. TSH was at 7.120 in 12/2022 with Free T4 at 1.18.   Signed, Ellsworth Lennox,  PA-C

## 2023-05-04 ENCOUNTER — Ambulatory Visit: Payer: Medicare Other | Admitting: Student

## 2023-05-05 ENCOUNTER — Telehealth: Payer: Self-pay | Admitting: *Deleted

## 2023-05-05 NOTE — Telephone Encounter (Signed)
 MRN # [id number] called and states that she has appointment 03/24 @ 3pm. Husband in hospital this week. She has not had lab work done. Wants to cancel appointment and another be sent to her in MyChart. Sent to front desk.

## 2023-05-08 ENCOUNTER — Ambulatory Visit: Payer: Medicare Other | Admitting: Nurse Practitioner

## 2023-05-08 DIAGNOSIS — E890 Postprocedural hypothyroidism: Secondary | ICD-10-CM

## 2023-05-09 LAB — TSH: TSH: 0.191 u[IU]/mL — ABNORMAL LOW (ref 0.450–4.500)

## 2023-05-09 LAB — T4, FREE: Free T4: 1.81 ng/dL — ABNORMAL HIGH (ref 0.82–1.77)

## 2023-05-11 ENCOUNTER — Encounter: Payer: Self-pay | Admitting: Nurse Practitioner

## 2023-05-11 ENCOUNTER — Ambulatory Visit (INDEPENDENT_AMBULATORY_CARE_PROVIDER_SITE_OTHER): Admitting: Nurse Practitioner

## 2023-05-11 VITALS — BP 132/82 | HR 58 | Ht 62.5 in | Wt 158.8 lb

## 2023-05-11 DIAGNOSIS — E890 Postprocedural hypothyroidism: Secondary | ICD-10-CM

## 2023-05-11 MED ORDER — LEVOTHYROXINE SODIUM 100 MCG PO TABS: 100.0000 ug | ORAL_TABLET | Freq: Every day | ORAL | 1 refills | Status: DC

## 2023-05-11 NOTE — Progress Notes (Signed)
 05/11/2023     Endocrinology Follow Up Note     Subjective:    Patient ID: Krista Ramirez, female    DOB: October 17, 1955, PCP Anabel Halon, MD.   Past Medical History:  Diagnosis Date   Age-related osteoporosis without current pathological fracture 03/16/2023   Anemia 04/27/2022   Anxiety 03/17/2021   COPD (chronic obstructive pulmonary disease) (HCC)    COPD (chronic obstructive pulmonary disease) (HCC)    Essential hypertension 01/03/2023   Gastroesophageal reflux disease 08/30/2022   Hyperthyroidism     Past Surgical History:  Procedure Laterality Date   SKIN GRAFT     TUBAL LIGATION      Social History   Socioeconomic History   Marital status: Married    Spouse name: Not on file   Number of children: Not on file   Years of education: Not on file   Highest education level: 12th grade  Occupational History   Not on file  Tobacco Use   Smoking status: Former    Current packs/day: 0.00    Average packs/day: 1 pack/day for 30.0 years (30.0 ttl pk-yrs)    Types: Cigarettes    Start date: 02/23/1991    Quit date: 02/22/2021    Years since quitting: 2.2   Smokeless tobacco: Never  Vaping Use   Vaping status: Never Used  Substance and Sexual Activity   Alcohol use: Never   Drug use: Never   Sexual activity: Yes    Birth control/protection: None  Other Topics Concern   Not on file  Social History Narrative   Not on file   Social Drivers of Health   Financial Resource Strain: Low Risk  (01/02/2023)   Overall Financial Resource Strain (CARDIA)    Difficulty of Paying Living Expenses: Not very hard  Food Insecurity: No Food Insecurity (01/02/2023)   Hunger Vital Sign    Worried About Running Out of Food in the Last Year: Never true    Ran Out of Food in the Last Year: Never true  Transportation Needs: No Transportation Needs (01/02/2023)   PRAPARE - Administrator, Civil Service (Medical): No    Lack of Transportation (Non-Medical): No   Physical Activity: Insufficiently Active (01/02/2023)   Exercise Vital Sign    Days of Exercise per Week: 3 days    Minutes of Exercise per Session: 30 min  Stress: No Stress Concern Present (01/02/2023)   [employer] of Occupational Health - Occupational Stress Questionnaire    Feeling of Stress : Not at all  Social Connections: Socially Integrated (01/02/2023)   Social Connection and Isolation Panel [NHANES]    Frequency of Communication with Friends and Family: More than three times a week    Frequency of Social Gatherings with Friends and Family: Once a week    Attends Religious Services: More than 4 times per year    Active Member of [employer] or Organizations: Yes    Attends Engineer, structural: More than 4 times per year    Marital Status: Married    Family History  Problem Relation Age of Onset   Hypertension Mother     Outpatient Encounter Medications as of 05/11/2023  Medication Sig   albuterol (VENTOLIN HFA) 108 (90 Base) MCG/ACT inhaler Inhale 2 puffs into the lungs every 6 (six) hours as needed for wheezing or shortness of breath.   alendronate (FOSAMAX) 70 MG tablet Take 1 tablet (70 mg total) by mouth every 7 (seven) days. Take  with a full glass of water on an empty stomach.   citalopram (CELEXA) 10 MG tablet Take 1 tablet (10 mg total) by mouth daily.   diltiazem (CARDIZEM CD) 120 MG 24 hr capsule Take 1 capsule (120 mg total) by mouth daily.   ELIQUIS 5 MG TABS tablet Take 1 tablet (5 mg total) by mouth 2 (two) times daily.   pantoprazole (PROTONIX) 40 MG tablet TAKE ONE TABLET BY MOUTH DAILY   [DISCONTINUED] levothyroxine (SYNTHROID) 112 MCG tablet Take 1 tablet (112 mcg total) by mouth daily before breakfast.   levothyroxine (SYNTHROID) 100 MCG tablet Take 1 tablet (100 mcg total) by mouth daily before breakfast.   No facility-administered encounter medications on file as of 05/11/2023.    ALLERGIES: No Known Allergies  VACCINATION  STATUS: Immunization History  Administered Date(s) Administered   Fluad Quad(high Dose 65+) 12/06/2021   Influenza-Unspecified 12/03/2020, 12/15/2022   Moderna Sars-Covid-2 Vaccination 08/19/2020   PNEUMOCOCCAL CONJUGATE-20 12/20/2021   Pfizer Covid-19 Vaccine Bivalent Booster 76yrs & up 02/17/2019, 03/10/2019, 11/04/2019   Zoster Recombinant(Shingrix) 12/20/2021, 02/25/2022     HPI  Krista Ramirez is 68 y.o. female who presents today with a medical history as above. she is being seen in follow up after being seen in consultation for hyperthyroidism requested by Anabel Halon, MD.  she has been dealing with symptoms of anxiety, insomnia, irritability, palpitations, weight loss, and tremors for 6-8 months. These symptoms are progressively worsening and troubling to her.  her most recent thyroid labs revealed suppressed TSH of < 0.010, high Free T3 of 26.5 and high Free T4 of 4.79 on 03/29/21. she denies dysphagia, choking, shortness of breath, no recent voice change.    she does have family history of thyroid dysfunction in her siblings, but denies family hx of thyroid cancer. she denies personal history of goiter. she is currently on Methimazole 10 mg po twice daily. Denies use of Biotin containing supplements.  she is willing to proceed with appropriate work up and therapy for thyrotoxicosis.   Review of systems  Constitutional: + steadily decreasing body weight,  current Body mass index is 28.58 kg/m. , no fatigue, no subjective hyperthermia, no subjective hypothermia Eyes: no blurry vision, no xerophthalmia ENT: no sore throat, no nodules palpated in throat, no dysphagia/odynophagia, no hoarseness Cardiovascular: no chest pain, no shortness of breath, no palpitations, no leg swelling Respiratory: no cough, no shortness of breath Gastrointestinal: no nausea/vomiting, diarrhea Musculoskeletal: no muscle/joint aches Skin: no rashes, no hyperemia Neurological: no tremors, no numbness,  no tingling, no dizziness Psychiatric: no depression, + anxiety r/t stress from husbands health issues   Objective:    BP 132/82 (BP Location: Right Arm, Patient Position: Sitting, Cuff Size: Large)   Pulse (!) 58   Ht 5' 2.5" (1.588 m)   Wt 158 lb 12.8 oz (72 kg)   BMI 28.58 kg/m   Wt Readings from Last 3 Encounters:  05/11/23 158 lb 12.8 oz (72 kg)  01/13/23 166 lb (75.3 kg)  01/05/23 167 lb 12.8 oz (76.1 kg)     BP Readings from Last 3 Encounters:  05/11/23 132/82  01/13/23 (!) 155/99  01/05/23 138/76     Physical Exam- Limited  Constitutional:  Body mass index is 28.58 kg/m. , not in acute distress, normal state of mind Eyes:  EOMI, no exophthalmos Musculoskeletal: no gross deformities, strength intact in all four extremities, no gross restriction of joint movements Skin:  no rashes, no hyperemia Neurological: no tremor with outstretched hands  CMP     Component Value Date/Time   NA 140 08/23/2022 1017   K 4.6 08/23/2022 1017   CL 106 08/23/2022 1017   CO2 19 (L) 08/23/2022 1017   GLUCOSE 85 08/23/2022 1017   GLUCOSE 105 (H) 03/30/2021 0438   BUN 15 08/23/2022 1017   CREATININE 1.13 (H) 08/23/2022 1017   CALCIUM 8.8 08/23/2022 1017   PROT 7.0 12/09/2021 1600   ALBUMIN 4.2 12/09/2021 1600   AST 17 12/09/2021 1600   ALT 20 12/09/2021 1600   ALKPHOS 233 (H) 12/09/2021 1600   BILITOT 0.2 12/09/2021 1600   GFRNONAA >60 03/30/2021 0438     CBC    Component Value Date/Time   WBC 5.0 08/23/2022 1017   WBC 6.4 04/28/2021 0806   RBC 3.42 (L) 08/23/2022 1017   RBC 3.79 (L) 04/28/2021 0806   HGB 10.3 (L) 08/23/2022 1017   HCT 32.9 (L) 08/23/2022 1017   PLT 235 08/23/2022 1017   MCV 96 08/23/2022 1017   MCH 30.1 08/23/2022 1017   MCH 26.6 04/28/2021 0806   MCHC 31.3 (L) 08/23/2022 1017   MCHC 30.2 04/28/2021 0806   RDW 15.5 (H) 08/23/2022 1017   LYMPHSABS 1.5 08/23/2022 1017   EOSABS 0.1 08/23/2022 1017   BASOSABS 0.0 08/23/2022 1017     Diabetic  Labs (most recent): No results found for: "HGBA1C", "MICROALBUR"  Lipid Panel  No results found for: "CHOL", "TRIG", "HDL", "CHOLHDL", "VLDL", "LDLCALC", "LDLDIRECT", "LABVLDL"   Lab Results  Component Value Date   TSH 0.191 (L) 05/08/2023   TSH 7.120 (H) 01/03/2023   TSH 19.050 (H) 09/29/2022   TSH 42.600 (H) 06/24/2022   TSH <0.005 (L) 03/03/2022   TSH <0.005 (L) 12/30/2021   TSH <0.005 (L) 10/27/2021   TSH <0.005 (L) 08/25/2021   TSH 0.108 (L) 06/28/2021   TSH <0.005 (L) 04/20/2021   FREET4 1.81 (H) 05/08/2023   FREET4 1.18 01/03/2023   FREET4 0.72 09/29/2022   FREET4 0.10 (L) 06/24/2022   FREET4 1.62 03/03/2022   FREET4 2.10 (H) 12/30/2021   FREET4 2.12 (H) 10/27/2021   FREET4 1.66 08/25/2021   FREET4 0.38 (L) 06/28/2021   FREET4 0.75 (L) 04/20/2021       Uptake and Scan from 04/29/21 CLINICAL DATA:  Hyperthyroidism. TSH equal 0.005. Nervousness. And increased appetite. Difficulty sleeping. Hair loss. Heart palpitations.   EXAM: THYROID SCAN AND UPTAKE - 4 AND 24 HOURS   TECHNIQUE: Following oral administration of I-123 capsule, anterior planar imaging was acquired at 24 hours. Thyroid uptake was calculated with a thyroid probe at 4-6 hours and 24 hours.   RADIOPHARMACEUTICALS:  Three hundred uCi I-123 sodium iodide p.o.   COMPARISON:  None.   FINDINGS: Uniform uptake within thyroid gland.  No nodularity.   4 hour I-123 uptake = 71.8% (normal 5-20%)   24 hour I-123 uptake = 59.4% (normal 10-30%)   IMPRESSION: 1. Thyroid imaging and I 123 uptake consistent with Graves disease. 2. Rapid turnover of I 123 at 4 hour uptake.     Electronically Signed   By: Genevive Bi M.D.   On: 04/29/2021 16:05   Latest Reference Range & Units 09/29/22 11:36 01/03/23 15:06 05/08/23 12:10  TSH 0.450 - 4.500 uIU/mL  7.120 (H) 0.191 (L)  T4,Free(Direct) 0.82 - 1.77 ng/dL 4.78 2.95 6.21 (H)  (H): Data is abnormally high (L): Data is abnormally low Assessment &  Plan:   1) Hypothyroidism- S/p RAI ablation for Graves Disease  she is being seen  at a kind request of Anabel Halon, MD.  She is S/P RAI ablation on 05/07/21 and once again on 04/08/22.   Her previsit TFTs are consistent with over-replacement- likely related to her recent weight loss.  She is advised to lower her Levothyroxine to 100 mcg po daily before breakfast.  Will recheck TFTs prior to next visit and adjust dose accordingly.   - The correct intake of thyroid hormone (Levothyroxine, Synthroid), is on empty stomach first thing in the morning, with water, separated by at least 30 minutes from breakfast and other medications,  and separated by more than 4 hours from calcium, iron, multivitamins, acid reflux medications (PPIs).  - This medication is a life-long medication and will be needed to correct thyroid hormone imbalances for the rest of your life.  The dose may change from time to time, based on thyroid blood work.  - It is extremely important to be consistent taking this medication, near the same time each morning.  -AVOID TAKING PRODUCTS CONTAINING BIOTIN (commonly found in Hair, Skin, Nails vitamins) AS IT INTERFERES WITH THE VALIDITY OF THYROID FUNCTION BLOOD TESTS.       -Patient is advised to maintain close follow up with Anabel Halon, MD for primary care needs.     I spent  26  minutes in the care of the patient today including review of labs from Thyroid Function, CMP, and other relevant labs ; imaging/biopsy records (current and previous including abstractions from other facilities); face-to-face time discussing  her lab results and symptoms, medications doses, her options of short and long term treatment based on the latest standards of care / guidelines;   and documenting the encounter.  Krista Ramirez  participated in the discussions, expressed understanding, and voiced agreement with the above plans.  All questions were answered to her satisfaction. she is  encouraged to contact clinic should she have any questions or concerns prior to her return visit.  Follow up plan: Return in about 4 months (around 09/10/2023) for Thyroid follow up, Previsit labs.   Thank you for involving me in the care of this pleasant patient, and I will continue to update you with her progress.   Ronny Bacon, [facility] [address] [place], [place] [address] Phone: [phone] Fax: [phone]  05/11/2023, 1:30 PM

## 2023-05-11 NOTE — Patient Instructions (Signed)

## 2023-05-18 IMAGING — NM NM RAI THERAPY FOR HYPERTHYROIDISM
1 series · 1 of 1 positions shown · non-contrast
Comparison: None.

CLINICAL DATA: Hyperthyroidism

EXAM:
RADIOACTIVE IODINE THERAPY FOR HYPERTHYROIDISM
TECHNIQUE: Radioactive iodine prescribed by myself. The risks and benefits of
radioactive iodine therapy were discussed with the patient in detail
by Dr. Egied. Alternative therapies were also mentioned. Radiation
safety was discussed with the patient, including how to protect the
general public from exposure. There were no barriers to
communication. Written consent was obtained. The patient then
received a capsule containing the radiopharmaceutical.
The patient will follow-up with the referring physician.
RADIOPHARMACEUTICALS:  20 mCi Z-4T4 sodium iodide orally

[Series 1: bone statics · 2.07mm/px · 1 of 1 slices shown]
[im 1/1]
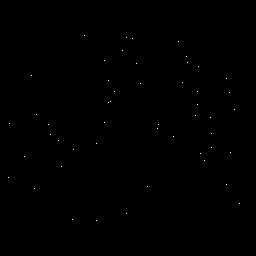

[1 of 1 positions shown; findings below may reference images not displayed]

IMPRESSION: Per oral administration of Z-4T4 sodium iodide for the treatment of
hyperthyroidism.

## 2023-07-06 ENCOUNTER — Ambulatory Visit (INDEPENDENT_AMBULATORY_CARE_PROVIDER_SITE_OTHER): Admitting: Internal Medicine

## 2023-07-06 ENCOUNTER — Encounter: Payer: Self-pay | Admitting: Internal Medicine

## 2023-07-06 VITALS — BP 120/77 | HR 67 | Ht 62.5 in | Wt 155.4 lb

## 2023-07-06 DIAGNOSIS — J439 Emphysema, unspecified: Secondary | ICD-10-CM | POA: Diagnosis not present

## 2023-07-06 DIAGNOSIS — I480 Paroxysmal atrial fibrillation: Secondary | ICD-10-CM

## 2023-07-06 DIAGNOSIS — I10 Essential (primary) hypertension: Secondary | ICD-10-CM

## 2023-07-06 DIAGNOSIS — E890 Postprocedural hypothyroidism: Secondary | ICD-10-CM | POA: Diagnosis not present

## 2023-07-06 DIAGNOSIS — D509 Iron deficiency anemia, unspecified: Secondary | ICD-10-CM

## 2023-07-06 DIAGNOSIS — R739 Hyperglycemia, unspecified: Secondary | ICD-10-CM

## 2023-07-06 DIAGNOSIS — F411 Generalized anxiety disorder: Secondary | ICD-10-CM

## 2023-07-06 DIAGNOSIS — E782 Mixed hyperlipidemia: Secondary | ICD-10-CM

## 2023-07-06 DIAGNOSIS — M810 Age-related osteoporosis without current pathological fracture: Secondary | ICD-10-CM

## 2023-07-06 MED ORDER — CITALOPRAM HYDROBROMIDE 20 MG PO TABS: 20.0000 mg | ORAL_TABLET | Freq: Every day | ORAL | 1 refills | Status: AC

## 2023-07-06 NOTE — Assessment & Plan Note (Addendum)
 BP Readings from Last 1 Encounters:  07/06/23 120/77   Well-controlled, has stopped taking diltiazem  120 mg once daily Counseled for compliance with the medications Advised DASH diet and moderate exercise/walking, at least 150 mins/week

## 2023-07-06 NOTE — Progress Notes (Signed)
 Established Patient Office Visit  Subjective:  Patient ID: Krista Ramirez, female    DOB: [birth year]  Age: 68 y.o. MRN: [id number]  CC:  Chief Complaint  Patient presents with   Medical Management of Chronic Issues    4 month f/u    HPI Krista Ramirez is a 68 y.o. female with past medical history of hyperthyroidism, atrial fibrillation and GAD who presents for f/u of her her chronic medical conditions.  She has had radioactive ablation X 2 of thyroid  for hyperthyroidism. Her thyroid  function test showed low TSH in 03/25 and her dose of levothyroxine  was decreased to 100 mcg QD. She denies tremors or palpitations currently.  She reports improvement in fatigue and weight has been stable now.  She has stopped taking Cardizem  and Eliquis , which were started for episode of A-fib with RVR during her hospital stay, which was likely provoked by uncontrolled hyperthyroidism.  She has seen cardiologist since then.  She currently denies any chest pain, dyspnea or palpitations.  Her BP has been well controlled at home.  She reports improvement in epigastric pain and burning episodes with Pantoprazole .  She had tried OTC antacids with some relief.  She had nausea and NBNB vomiting as well.  Denies any melena or hematochezia.   GAD: She has been stressed due to her husband's recurrent hospitalizations.  She has worsening of anxiety recently.  She currently takes Celexa  10 mg QD, which was effective till recently.  Denies anhedonia, SI or HI currently.   Past Medical History:  Diagnosis Date   Age-related osteoporosis without current pathological fracture 03/16/2023   Anemia 04/27/2022   Anxiety 03/17/2021   COPD (chronic obstructive pulmonary disease) (HCC)    COPD (chronic obstructive pulmonary disease) (HCC)    Essential hypertension 01/03/2023   Gastroesophageal reflux disease 08/30/2022   Hyperthyroidism     Past Surgical History:  Procedure Laterality Date   SKIN GRAFT     TUBAL LIGATION       Family History  Problem Relation Age of Onset   Hypertension Mother     Social History   Socioeconomic History   Marital status: Married    Spouse name: Not on file   Number of children: Not on file   Years of education: Not on file   Highest education level: 12th grade  Occupational History   Not on file  Tobacco Use   Smoking status: Former    Current packs/day: 0.00    Average packs/day: 1 pack/day for 30.0 years (30.0 ttl pk-yrs)    Types: Cigarettes    Start date: 02/23/1991    Quit date: 02/22/2021    Years since quitting: 2.3   Smokeless tobacco: Never  Vaping Use   Vaping status: Never Used  Substance and Sexual Activity   Alcohol use: Never   Drug use: Never   Sexual activity: Yes    Birth control/protection: None  Other Topics Concern   Not on file  Social History Narrative   Not on file   Social Drivers of Health   Financial Resource Strain: Low Risk  (01/02/2023)   Overall Financial Resource Strain (CARDIA)    Difficulty of Paying Living Expenses: Not very hard  Food Insecurity: No Food Insecurity (01/02/2023)   Hunger Vital Sign    Worried About Running Out of Food in the Last Year: Never true    Ran Out of Food in the Last Year: Never true  Transportation Needs: No Transportation Needs (01/02/2023)   PRAPARE -  Administrator, Civil Service (Medical): No    Lack of Transportation (Non-Medical): No  Physical Activity: Insufficiently Active (01/02/2023)   Exercise Vital Sign    Days of Exercise per Week: 3 days    Minutes of Exercise per Session: 30 min  Stress: No Stress Concern Present (01/02/2023)   [employer] of Occupational Health - Occupational Stress Questionnaire    Feeling of Stress : Not at all  Social Connections: Socially Integrated (01/02/2023)   Social Connection and Isolation Panel [NHANES]    Frequency of Communication with Friends and Family: More than three times a week    Frequency of Social Gatherings with  Friends and Family: Once a week    Attends Religious Services: More than 4 times per year    Active Member of [employer] or Organizations: Yes    Attends Engineer, structural: More than 4 times per year    Marital Status: Married  Catering manager Violence: Not At Risk (09/02/2021)   Humiliation, Afraid, Rape, and Kick questionnaire    Fear of Current or Ex-Partner: No    Emotionally Abused: No    Physically Abused: No    Sexually Abused: No    Outpatient Medications Prior to Visit  Medication Sig Dispense Refill   albuterol  (VENTOLIN  HFA) 108 (90 Base) MCG/ACT inhaler Inhale 2 puffs into the lungs every 6 (six) hours as needed for wheezing or shortness of breath. 8 g 5   levothyroxine  (SYNTHROID ) 100 MCG tablet Take 1 tablet (100 mcg total) by mouth daily before breakfast. 90 tablet 1   pantoprazole  (PROTONIX ) 40 MG tablet TAKE ONE TABLET BY MOUTH DAILY 30 tablet 3   alendronate  (FOSAMAX ) 70 MG tablet Take 1 tablet (70 mg total) by mouth every 7 (seven) days. Take with a full glass of water on an empty stomach. 12 tablet 3   citalopram  (CELEXA ) 10 MG tablet Take 1 tablet (10 mg total) by mouth daily. 90 tablet 1   diltiazem  (CARDIZEM  CD) 120 MG 24 hr capsule Take 1 capsule (120 mg total) by mouth daily. 30 capsule 5   ELIQUIS  5 MG TABS tablet Take 1 tablet (5 mg total) by mouth 2 (two) times daily. 60 tablet 5   No facility-administered medications prior to visit.    No Known Allergies  ROS Review of Systems  Constitutional:  Positive for fatigue. Negative for chills and fever.  HENT:  Negative for congestion, postnasal drip, sinus pressure and sinus pain.   Eyes:  Negative for pain and discharge.  Respiratory:  Negative for cough and shortness of breath.   Cardiovascular:  Negative for chest pain and palpitations.  Gastrointestinal:  Negative for diarrhea, nausea and vomiting.  Endocrine: Negative for polydipsia and polyuria.  Genitourinary:  Negative for dysuria and  hematuria.  Musculoskeletal:  Positive for arthralgias (Right thumb). Negative for neck pain and neck stiffness.  Skin:  Negative for rash.  Neurological:  Negative for dizziness and weakness.  Psychiatric/Behavioral:  Negative for agitation and behavioral problems. The patient is nervous/anxious.       Objective:    Physical Exam Vitals reviewed.  Constitutional:      General: She is not in acute distress.    Appearance: She is not diaphoretic.  HENT:     Head: Normocephalic and atraumatic.     Nose: No congestion.     Mouth/Throat:     Mouth: Mucous membranes are moist.  Eyes:     General: No scleral icterus.  Extraocular Movements: Extraocular movements intact.  Cardiovascular:     Rate and Rhythm: Normal rate and regular rhythm.     Heart sounds: Normal heart sounds. No murmur heard. Pulmonary:     Breath sounds: Normal breath sounds. No wheezing or rales.  Musculoskeletal:     Cervical back: Neck supple. No tenderness.     Right lower leg: No edema.     Left lower leg: No edema.  Skin:    General: Skin is warm.     Findings: No rash.  Neurological:     General: No focal deficit present.     Mental Status: She is alert and oriented to person, place, and time.     Sensory: No sensory deficit.     Motor: No weakness.  Psychiatric:        Mood and Affect: Mood normal.        Behavior: Behavior normal.     BP 120/77   Pulse 67   Ht 5' 2.5" (1.588 m)   Wt 155 lb 6.4 oz (70.5 kg)   SpO2 97%   BMI 27.97 kg/m  Wt Readings from Last 3 Encounters:  07/06/23 155 lb 6.4 oz (70.5 kg)  05/11/23 158 lb 12.8 oz (72 kg)  01/13/23 166 lb (75.3 kg)    Lab Results  Component Value Date   TSH 0.191 (L) 05/08/2023   Lab Results  Component Value Date   WBC 5.0 08/23/2022   HGB 10.3 (L) 08/23/2022   HCT 32.9 (L) 08/23/2022   MCV 96 08/23/2022   PLT 235 08/23/2022   Lab Results  Component Value Date   NA 140 08/23/2022   K 4.6 08/23/2022   CO2 19 (L) 08/23/2022    GLUCOSE 85 08/23/2022   BUN 15 08/23/2022   CREATININE 1.13 (H) 08/23/2022   BILITOT 0.2 12/09/2021   ALKPHOS 233 (H) 12/09/2021   AST 17 12/09/2021   ALT 20 12/09/2021   PROT 7.0 12/09/2021   ALBUMIN 4.2 12/09/2021   CALCIUM 8.8 08/23/2022   ANIONGAP 9 03/30/2021   EGFR 53 (L) 08/23/2022   No results found for: "CHOL" No results found for: "HDL" No results found for: "LDLCALC" No results found for: "TRIG" No results found for: "CHOLHDL" No results found for: "HGBA1C"    Assessment & Plan:   Problem List Items Addressed This Visit       Cardiovascular and Mediastinum   Atrial fibrillation (HCC)   Had A-fib with RVR during hospital visit, likely due to uncontrolled hyperthyroidism in the past Currently in sinus rhythm DC Diltiazem  and Eliquis  as she has stopped it after cardiac monitor showing normal sinus rhythm without any episode of A-fib Followed by cardiology      Essential hypertension - Primary   BP Readings from Last 1 Encounters:  07/06/23 120/77   Well-controlled, has stopped taking diltiazem  120 mg once daily Counseled for compliance with the medications Advised DASH diet and moderate exercise/walking, at least 150 mins/week       Relevant Orders   CBC with Differential/Platelet   CMP14+EGFR     Respiratory   COPD (chronic obstructive pulmonary disease) (HCC)   Has quit smoking now Has history of 30-pack-year smoking Uses albuterol  as needed for dyspnea or wheezing        Endocrine   Postablative hypothyroidism   Lab Results  Component Value Date   TSH 0.191 (L) 05/08/2023   On Levothyroxine  100 mcg once daily, recently decreased dose Followed by Endocrinology  Musculoskeletal and Integument   Age-related osteoporosis without current pathological fracture   Was on Alendronate , but she stopped it due to diarrhea Advised to continue vitamin D supplement for now Plan to add Boniva later        Other   GAD (generalized anxiety  disorder)   Uncontrolled with Celexa  10 mg QD Has been stressed due to her husband's recent hospitalizations Increased dose of Celexa  to 20 mg QD      Relevant Medications   citalopram  (CELEXA ) 20 MG tablet   Anemia   Denies any active signs of bleeding currently Was on Eliquis  for history of A-fib Checked CBC and iron profile Advised to take ferrous sulfate 325 mg once daily Recheck CBC      Relevant Orders   CBC with Differential/Platelet   Other Visit Diagnoses       Mixed hyperlipidemia       Relevant Orders   Lipid Profile     Hyperglycemia       Relevant Orders   Hemoglobin A1c        Meds ordered this encounter  Medications   citalopram  (CELEXA ) 20 MG tablet    Sig: Take 1 tablet (20 mg total) by mouth daily.    Dispense:  90 tablet    Refill:  1    Follow-up: Return in about 6 months (around 01/06/2024) for GAD.    Meldon Sport, MD

## 2023-07-06 NOTE — Patient Instructions (Addendum)
 Please schedule Medicare Annual Wellness.  Please start taking Celexa  20 mg once daily.  Please continue to take medications as prescribed.  Please continue to follow low carb diet and perform moderate exercise/walking at least 150 mins/week.  Please get fasting blood tests done in July with thyroid  blood tests.

## 2023-07-06 NOTE — Assessment & Plan Note (Addendum)
 Had A-fib with RVR during hospital visit, likely due to uncontrolled hyperthyroidism in the past Currently in sinus rhythm DC Diltiazem  and Eliquis  as she has stopped it after cardiac monitor showing normal sinus rhythm without any episode of A-fib Followed by cardiology

## 2023-07-06 NOTE — Assessment & Plan Note (Addendum)
 Uncontrolled with Celexa  10 mg QD Has been stressed due to her husband's recent hospitalizations Increased dose of Celexa  to 20 mg QD

## 2023-07-06 NOTE — Assessment & Plan Note (Signed)
 Lab Results  Component Value Date   TSH 0.191 (L) 05/08/2023   On Levothyroxine  100 mcg once daily, recently decreased dose Followed by Endocrinology

## 2023-07-06 NOTE — Assessment & Plan Note (Addendum)
 Was on Alendronate , but she stopped it due to diarrhea Advised to continue vitamin D supplement for now Plan to add Boniva later

## 2023-07-06 NOTE — Assessment & Plan Note (Signed)
Has quit smoking now Has history of 30-pack-year smoking Uses albuterol as needed for dyspnea or wheezing 

## 2023-07-07 NOTE — Assessment & Plan Note (Signed)
 Denies any active signs of bleeding currently Was on Eliquis  for history of A-fib Checked CBC and iron profile Advised to take ferrous sulfate 325 mg once daily Recheck CBC

## 2023-08-16 NOTE — Progress Notes (Signed)
 Cardiology Office Note    Date:  08/17/2023  ID:  Krista Ramirez, DOB August 29, 1955, MRN [id number] Cardiologist: Alvan Carrier, MD    History of Present Illness:    Krista Ramirez is a 68 y.o. female with past medical history of paroxysmal atrial fibrillation (occurred in 03/2021 in the setting of hyperthyroidism), COPD and hyperthyroidism (s/p radioactive iodine ablation in 04/2021) who presents to the office today for 15-month follow-up.   She was examined by Dr. Alvan in 12/2022 and denied any recent palpitations at that time. It was recommended to obtain a 30-day monitor now that she was hypothyroid/euthyroid and if benign monitor, would discontinue Cardizem  and Eliquis . Her monitor showed predominantly normal sinus rhythm with an average heart rate of 63 bpm. She did have rare PAC's and PVC's but no significant arrhythmias. Was recommended to reassess at follow-up and could consider coming off medications since no recurrent atrial fibrillation.  In talking with the patient today, she reports overall feeling well since her last office visit. She denies any recent chest pain or palpitations. No recent dyspnea on exertion, orthopnea, PND or pitting edema. Reports her energy level has improved since Synthroid  dosing was recently adjusted. She did stop Cardizem  and Eliquis  after her monitor resulted. She is the primary caregiver for her husband who has end-stage liver disease and is awaiting a transplant.  Studies Reviewed:   EKG: EKG is ordered today and demonstrates:   EKG Interpretation Date/Time:  Thursday August 17 2023 14:39:36 EDT Ventricular Rate:  73 PR Interval:  170 QRS Duration:  72 QT Interval:  414 QTC Calculation: 456 R Axis:   35  Text Interpretation: Sinus rhythm with occasional Premature ventricular complexes Low voltage QRS Confirmed by Johnson Grate ([id number]) on 08/17/2023 2:43:44 PM       Echocardiogram: 03/2021 IMPRESSIONS     1. Left ventricular ejection  fraction, by estimation, is 60 to 65%. The  left ventricle has normal function. The left ventricle has no regional  wall motion abnormalities. Left ventricular diastolic parameters are  indeterminate.   2. Right ventricular systolic function is normal. The right ventricular  size is normal. There is normal pulmonary artery systolic pressure.   3. Left atrial size was mildly dilated.   4. The mitral valve is normal in structure. Mild mitral valve  regurgitation. No evidence of mitral stenosis.   5. The aortic valve is tricuspid. Aortic valve regurgitation is mild. No  aortic stenosis is present.   6. The inferior vena cava is normal in size with greater than 50%  respiratory variability, suggesting right atrial pressure of 3 mmHg.    Event Monitor: 12/2022   30 day monitor, data available only from 75% of planned monitored time   Min HR 41, Max HR 148, Avg HR 63   Rare supraventricular ectopy   Rare ventricular ectopy   Telemetry tracings show sinus rhythm with PACs   No significant arrhythmias  Risk Assessment/Calculations:    CHA2DS2-VASc Score = 2   This indicates a 2.2% annual risk of stroke. The patient's score is based upon: CHF History: 0 HTN History: 0 Diabetes History: 0 Stroke History: 0 Vascular Disease History: 0 Age Score: 1 Gender Score: 1    Physical Exam:   VS:  BP 132/80 (BP Location: Left Arm, Cuff Size: Normal)   Pulse 79   Ht 5' 3 (1.6 m)   Wt 150 lb 9.6 oz (68.3 kg)   SpO2 97%   BMI 26.68 kg/m  Wt Readings from Last 3 Encounters:  08/17/23 150 lb 9.6 oz (68.3 kg)  07/06/23 155 lb 6.4 oz (70.5 kg)  05/11/23 158 lb 12.8 oz (72 kg)     GEN: Pleasant female appearing in no acute distress NECK: No JVD; No carotid bruits CARDIAC: RRR, no murmurs, rubs, gallops RESPIRATORY:  Clear to auscultation without rales, wheezing or rhonchi  ABDOMEN: Appears non-distended. No obvious abdominal masses. EXTREMITIES: No clubbing or cyanosis. No pitting  edema.  Distal pedal pulses are 2+ bilaterally.   Assessment and Plan:   1. Paroxysmal Atrial Fibrillation - As discussed above, recent monitor showed no recurrence and she has already discontinued Eliquis  and Cardizem . She denies any recent palpitations and remains in NSR today. No additional medical therapy indicated at this time.    2. Hyperthyroidism - She previously underwent ablation. TSH was at 7.120 in 12/2022 with Free T4 at 1.18. TSH was low at 0.191 in 04/2023 and Synthroid  dosing was reduced to 100 mcg daily. She is followed closely by Endocrinology.   Disposition: Will plan for annual follow-up unless she develops new symptoms in the interim.   Signed, Laymon CHRISTELLA Qua, PA-C

## 2023-08-17 ENCOUNTER — Encounter: Payer: Self-pay | Admitting: Student

## 2023-08-17 ENCOUNTER — Ambulatory Visit: Attending: Student | Admitting: Student

## 2023-08-17 VITALS — BP 132/80 | HR 79 | Ht 63.0 in | Wt 150.6 lb

## 2023-08-17 DIAGNOSIS — E0590 Thyrotoxicosis, unspecified without thyrotoxic crisis or storm: Secondary | ICD-10-CM | POA: Insufficient documentation

## 2023-08-17 DIAGNOSIS — I480 Paroxysmal atrial fibrillation: Secondary | ICD-10-CM | POA: Diagnosis present

## 2023-08-17 NOTE — Patient Instructions (Signed)
 Medication Instructions:  Your physician recommends that you continue on your current medications as directed. Please refer to the Current Medication list given to you today.  *If you need a refill on your cardiac medications before your next appointment, please call your pharmacy*  Lab Work: NONE   If you have labs (blood work) drawn today and your tests are completely normal, you will receive your results only by: MyChart Message (if you have MyChart) OR A paper copy in the mail If you have any lab test that is abnormal or we need to change your treatment, we will call you to review the results.  Testing/Procedures: NONE   Follow-Up: At [facility], you and your health needs are our priority.  As part of our continuing mission to provide you with exceptional heart care, our providers are all part of one team.  This team includes your primary Cardiologist (physician) and Advanced Practice Providers or APPs (Physician Assistants and Nurse Practitioners) who all work together to provide you with the care you need, when you need it.  Your next appointment:   1 year(s)  Provider:   You may see Dina Rich, MD or one of the following Advanced Practice Providers on your designated Care Team:   Randall An, PA-C  [place], [place] Jacolyn Reedy, [place]     We recommend signing up for the patient portal called "MyChart".  Sign up information is provided on this After Visit Summary.  MyChart is used to connect with patients for Virtual Visits (Telemedicine).  Patients are able to view lab/test results, encounter notes, upcoming appointments, etc.  Non-urgent messages can be sent to your provider as well.   To learn more about what you can do with MyChart, go to [email or web address].   Other Instructions Thank you for choosing [facility]!

## 2023-08-18 ENCOUNTER — Encounter: Payer: Self-pay | Admitting: Student

## 2023-08-18 LAB — CBC WITH DIFFERENTIAL/PLATELET
Basophils Absolute: 0 x10E3/uL (ref 0.0–0.2)
Basos: 0 %
EOS (ABSOLUTE): 0.1 x10E3/uL (ref 0.0–0.4)
Eos: 1 %
Hematocrit: 37.2 % (ref 34.0–46.6)
Hemoglobin: 11.9 g/dL (ref 11.1–15.9)
Immature Grans (Abs): 0 x10E3/uL (ref 0.0–0.1)
Immature Granulocytes: 0 %
Lymphocytes Absolute: 1.7 x10E3/uL (ref 0.7–3.1)
Lymphs: 35 %
MCH: 30.7 pg (ref 26.6–33.0)
MCHC: 32 g/dL (ref 31.5–35.7)
MCV: 96 fL (ref 79–97)
Monocytes Absolute: 0.3 x10E3/uL (ref 0.1–0.9)
Monocytes: 7 %
Neutrophils Absolute: 2.8 x10E3/uL (ref 1.4–7.0)
Neutrophils: 57 %
Platelets: 221 x10E3/uL (ref 150–450)
RBC: 3.87 x10E6/uL (ref 3.77–5.28)
RDW: 14.2 % (ref 11.7–15.4)
WBC: 4.9 x10E3/uL (ref 3.4–10.8)

## 2023-08-18 LAB — CMP14+EGFR
ALT: 8 IU/L (ref 0–32)
AST: 17 IU/L (ref 0–40)
Albumin: 4.3 g/dL (ref 3.9–4.9)
Alkaline Phosphatase: 117 IU/L (ref 44–121)
BUN/Creatinine Ratio: 13 (ref 12–28)
BUN: 11 mg/dL (ref 8–27)
Bilirubin Total: 0.3 mg/dL (ref 0.0–1.2)
CO2: 19 mmol/L — ABNORMAL LOW (ref 20–29)
Calcium: 9.2 mg/dL (ref 8.7–10.3)
Chloride: 107 mmol/L — ABNORMAL HIGH (ref 96–106)
Creatinine, Ser: 0.84 mg/dL (ref 0.57–1.00)
Globulin, Total: 2.4 g/dL (ref 1.5–4.5)
Glucose: 95 mg/dL (ref 70–99)
Potassium: 4.2 mmol/L (ref 3.5–5.2)
Sodium: 141 mmol/L (ref 134–144)
Total Protein: 6.7 g/dL (ref 6.0–8.5)
eGFR: 76 mL/min/1.73 (ref >59)

## 2023-08-18 LAB — LIPID PANEL
Chol/HDL Ratio: 5.7 ratio — ABNORMAL HIGH (ref 0.0–4.4)
Cholesterol, Total: 181 mg/dL (ref 100–199)
HDL: 32 mg/dL — ABNORMAL LOW (ref >39)
LDL Chol Calc (NIH): 126 mg/dL — ABNORMAL HIGH (ref 0–99)
Triglycerides: 125 mg/dL (ref 0–149)
VLDL Cholesterol Cal: 23 mg/dL (ref 5–40)

## 2023-08-18 LAB — HEMOGLOBIN A1C
Est. average glucose Bld gHb Est-mCnc: 108 mg/dL
Hgb A1c MFr Bld: 5.4 % (ref 4.8–5.6)

## 2023-08-18 LAB — TSH: TSH: 0.047 u[IU]/mL — ABNORMAL LOW (ref 0.450–4.500)

## 2023-08-18 LAB — T4, FREE: Free T4: 1.85 ng/dL — ABNORMAL HIGH (ref 0.82–1.77)

## 2023-08-21 ENCOUNTER — Ambulatory Visit: Payer: Self-pay | Admitting: Internal Medicine

## 2023-09-14 ENCOUNTER — Ambulatory Visit: Admitting: Nurse Practitioner

## 2023-10-19 ENCOUNTER — Telehealth: Payer: Self-pay | Admitting: *Deleted

## 2023-10-19 DIAGNOSIS — E890 Postprocedural hypothyroidism: Secondary | ICD-10-CM

## 2023-10-19 MED ORDER — LEVOTHYROXINE SODIUM 88 MCG PO TABS: 88.0000 ug | ORAL_TABLET | Freq: Every day | ORAL | 1 refills | Status: DC

## 2023-10-19 NOTE — Telephone Encounter (Signed)
 Patient left a message that she was still at [facility] in [place] with her husband , who has had a Liver Transplant. She is asking for a refill on her thyroid  medication,she thinks that it was decreased from 100 mcg to 88 mcg. She is asking that the refill request be sent to [facility].

## 2023-10-19 NOTE — Addendum Note (Signed)
 Addended by: Thressa Shiffer J on: 10/19/2023 02:32 PM   Modules accepted: Orders

## 2023-10-19 NOTE — Telephone Encounter (Signed)
 I took care of it.  I sent in the 88 mcg dose to [name] for her.  She will need some repeat labs in about 3 months or so.

## 2023-10-23 NOTE — Telephone Encounter (Signed)
 Patient was called and made aware.

## 2023-11-07 ENCOUNTER — Other Ambulatory Visit: Payer: Self-pay | Admitting: Nurse Practitioner

## 2023-11-07 DIAGNOSIS — E890 Postprocedural hypothyroidism: Secondary | ICD-10-CM

## 2023-12-12 ENCOUNTER — Telehealth: Payer: Self-pay | Admitting: *Deleted

## 2023-12-12 DIAGNOSIS — E890 Postprocedural hypothyroidism: Secondary | ICD-10-CM

## 2023-12-12 NOTE — Telephone Encounter (Signed)
 Message in September said to repeat labs in 3 months and follow up in office. So I guess we need to get her scheduled.  I will enter the lab orders.

## 2023-12-12 NOTE — Telephone Encounter (Signed)
 Patient left a message that she needs appointment and lab work. Her husband has been in [facility] in [place] for months and is now home.  She ask for a call back with appointment, and where to go and get labs.

## 2023-12-13 ENCOUNTER — Telehealth: Payer: Self-pay | Admitting: *Deleted

## 2023-12-13 NOTE — Telephone Encounter (Signed)
 Talked with the patient and she will take the 11/05 appointment and will get labs done this week.

## 2023-12-13 NOTE — Telephone Encounter (Signed)
 Sent pt mychart message about appt time and let pt know about labs

## 2023-12-15 LAB — TSH: TSH: 7.44 u[IU]/mL — ABNORMAL HIGH (ref 0.450–4.500)

## 2023-12-15 LAB — T4, FREE: Free T4: 0.9 ng/dL (ref 0.82–1.77)

## 2023-12-18 NOTE — Patient Instructions (Signed)

## 2023-12-20 ENCOUNTER — Encounter: Payer: Self-pay | Admitting: Nurse Practitioner

## 2023-12-20 ENCOUNTER — Ambulatory Visit (INDEPENDENT_AMBULATORY_CARE_PROVIDER_SITE_OTHER): Admitting: Nurse Practitioner

## 2023-12-20 VITALS — BP 132/88 | HR 67 | Ht 63.0 in | Wt 158.4 lb

## 2023-12-20 DIAGNOSIS — E890 Postprocedural hypothyroidism: Secondary | ICD-10-CM | POA: Diagnosis not present

## 2023-12-20 MED ORDER — LEVOTHYROXINE SODIUM 88 MCG PO TABS: 88.0000 ug | ORAL_TABLET | Freq: Every day | ORAL | 1 refills | Status: DC

## 2023-12-20 NOTE — Progress Notes (Addendum)
 12/20/2023     Endocrinology Follow Up Note     Subjective:    Patient ID: Krista Ramirez, female    DOB: 01-11-1956, PCP Tobie Suzzane POUR, MD.   Past Medical History:  Diagnosis Date   Age-related osteoporosis without current pathological fracture 03/16/2023   Anemia 04/27/2022   Anxiety 03/17/2021   COPD (chronic obstructive pulmonary disease) (HCC)    COPD (chronic obstructive pulmonary disease) (HCC)    Essential hypertension 01/03/2023   Gastroesophageal reflux disease 08/30/2022   Hyperthyroidism     Past Surgical History:  Procedure Laterality Date   SKIN GRAFT     TUBAL LIGATION      Social History   Socioeconomic History   Marital status: Married    Spouse name: Not on file   Number of children: Not on file   Years of education: Not on file   Highest education level: 12th grade  Occupational History   Not on file  Tobacco Use   Smoking status: Former    Current packs/day: 0.00    Average packs/day: 1 pack/day for 30.0 years (30.0 ttl pk-yrs)    Types: Cigarettes    Start date: 02/23/1991    Quit date: 02/22/2021    Years since quitting: 2.8   Smokeless tobacco: Never  Vaping Use   Vaping status: Never Used  Substance and Sexual Activity   Alcohol use: Never   Drug use: Never   Sexual activity: Yes    Birth control/protection: None  Other Topics Concern   Not on file  Social History Narrative   Not on file   Social Drivers of Health   Financial Resource Strain: Low Risk  (01/02/2023)   Overall Financial Resource Strain (CARDIA)    Difficulty of Paying Living Expenses: Not very hard  Food Insecurity: No Food Insecurity (01/02/2023)   Hunger Vital Sign    Worried About Running Out of Food in the Last Year: Never true    Ran Out of Food in the Last Year: Never true  Transportation Needs: No Transportation Needs (01/02/2023)   PRAPARE - Administrator, Civil Service (Medical): No    Lack of Transportation (Non-Medical): No   Physical Activity: Insufficiently Active (01/02/2023)   Exercise Vital Sign    Days of Exercise per Week: 3 days    Minutes of Exercise per Session: 30 min  Stress: No Stress Concern Present (01/02/2023)   [employer] of Occupational Health - Occupational Stress Questionnaire    Feeling of Stress : Not at all  Social Connections: Socially Integrated (01/02/2023)   Social Connection and Isolation Panel    Frequency of Communication with Friends and Family: More than three times a week    Frequency of Social Gatherings with Friends and Family: Once a week    Attends Religious Services: More than 4 times per year    Active Member of [employer] or Organizations: Yes    Attends Engineer, Structural: More than 4 times per year    Marital Status: Married    Family History  Problem Relation Age of Onset   Hypertension Mother     Outpatient Encounter Medications as of 12/20/2023  Medication Sig   albuterol  (VENTOLIN  HFA) 108 (90 Base) MCG/ACT inhaler Inhale 2 puffs into the lungs every 6 (six) hours as needed for wheezing or shortness of breath.   citalopram  (CELEXA ) 20 MG tablet Take 1 tablet (20 mg total) by mouth daily.   pantoprazole  (PROTONIX ) 40  MG tablet TAKE ONE TABLET BY MOUTH DAILY   [DISCONTINUED] levothyroxine  (SYNTHROID ) 88 MCG tablet Take 1 tablet (88 mcg total) by mouth daily before breakfast.   levothyroxine  (SYNTHROID ) 88 MCG tablet Take 1 tablet (88 mcg total) by mouth daily before breakfast.   No facility-administered encounter medications on file as of 12/20/2023.    ALLERGIES: No Known Allergies  VACCINATION STATUS: Immunization History  Administered Date(s) Administered   Fluad Quad(high Dose 65+) 12/06/2021   Influenza-Unspecified 12/03/2020, 12/15/2022   Moderna Sars-Covid-2 Vaccination 08/19/2020   PNEUMOCOCCAL CONJUGATE-20 12/20/2021   Pfizer Covid-19 Vaccine Bivalent Booster 72yrs & up 02/17/2019, 03/10/2019, 11/04/2019   Zoster  Recombinant(Shingrix) 12/20/2021, 02/25/2022     HPI  Krista Ramirez is 68 y.o. female who presents today with a medical history as above. she is being seen in follow up after being seen in consultation for hyperthyroidism requested by Tobie Suzzane POUR, MD.  she has been dealing with symptoms of anxiety, insomnia, irritability, palpitations, weight loss, and tremors for 6-8 months. These symptoms are progressively worsening and troubling to her.  her most recent thyroid  labs revealed suppressed TSH of < 0.010, high Free T3 of 26.5 and high Free T4 of 4.79 on 03/29/21. she denies dysphagia, choking, shortness of breath, no recent voice change.    she does have family history of thyroid  dysfunction in her siblings, but denies family hx of thyroid  cancer. she denies personal history of goiter. she is currently on Methimazole  10 mg po twice daily. Denies use of Biotin containing supplements.  she is willing to proceed with appropriate work up and therapy for thyrotoxicosis.   Review of systems  Constitutional: + fluctuating body weight,  current Body mass index is 28.06 kg/m. , no fatigue, no subjective hyperthermia, no subjective hypothermia Eyes: no blurry vision, no xerophthalmia ENT: no sore throat, no nodules palpated in throat, no dysphagia/odynophagia, no hoarseness Cardiovascular: no chest pain, no shortness of breath, no palpitations, no leg swelling Respiratory: no cough, no shortness of breath Gastrointestinal: no nausea/vomiting, diarrhea Musculoskeletal: no muscle/joint aches Skin: no rashes, no hyperemia Neurological: no tremors, no numbness, no tingling, no dizziness Psychiatric: no depression, + anxiety r/t stress from husbands health issues   Objective:    BP 132/88 (BP Location: Left Arm, Patient Position: Sitting, Cuff Size: Large)   Pulse 67   Ht 5' 3 (1.6 m)   Wt 158 lb 6.4 oz (71.8 kg)   BMI 28.06 kg/m   Wt Readings from Last 3 Encounters:  12/20/23 158 lb 6.4 oz  (71.8 kg)  08/17/23 150 lb 9.6 oz (68.3 kg)  07/06/23 155 lb 6.4 oz (70.5 kg)     BP Readings from Last 3 Encounters:  12/20/23 132/88  08/17/23 132/80  07/06/23 120/77     Physical Exam- Limited  Constitutional:  Body mass index is 28.06 kg/m. , not in acute distress, normal state of mind Eyes:  EOMI, no exophthalmos Musculoskeletal: no gross deformities, strength intact in all four extremities, no gross restriction of joint movements Skin:  no rashes, no hyperemia Neurological: no tremor with outstretched hands   CMP     Component Value Date/Time   NA 141 08/17/2023 1413   K 4.2 08/17/2023 1413   CL 107 (H) 08/17/2023 1413   CO2 19 (L) 08/17/2023 1413   GLUCOSE 95 08/17/2023 1413   GLUCOSE 105 (H) 03/30/2021 0438   BUN 11 08/17/2023 1413   CREATININE 0.84 08/17/2023 1413   CALCIUM 9.2 08/17/2023 1413   PROT  6.7 08/17/2023 1413   ALBUMIN 4.3 08/17/2023 1413   AST 17 08/17/2023 1413   ALT 8 08/17/2023 1413   ALKPHOS 117 08/17/2023 1413   BILITOT 0.3 08/17/2023 1413   GFRNONAA >60 03/30/2021 0438     CBC    Component Value Date/Time   WBC 4.9 08/17/2023 1413   WBC 6.4 04/28/2021 0806   RBC 3.87 08/17/2023 1413   RBC 3.79 (L) 04/28/2021 0806   HGB 11.9 08/17/2023 1413   HCT 37.2 08/17/2023 1413   PLT 221 08/17/2023 1413   MCV 96 08/17/2023 1413   MCH 30.7 08/17/2023 1413   MCH 26.6 04/28/2021 0806   MCHC 32.0 08/17/2023 1413   MCHC 30.2 04/28/2021 0806   RDW 14.2 08/17/2023 1413   LYMPHSABS 1.7 08/17/2023 1413   EOSABS 0.1 08/17/2023 1413   BASOSABS 0.0 08/17/2023 1413     Diabetic Labs (most recent): Lab Results  Component Value Date   HGBA1C 5.4 08/17/2023    Lipid Panel     Component Value Date/Time   CHOL 181 08/17/2023 1413   TRIG 125 08/17/2023 1413   HDL 32 (L) 08/17/2023 1413   CHOLHDL 5.7 (H) 08/17/2023 1413   LDLCALC 126 (H) 08/17/2023 1413   LABVLDL 23 08/17/2023 1413     Lab Results  Component Value Date   TSH 7.440 (H)  12/14/2023   TSH 0.047 (L) 08/17/2023   TSH 0.191 (L) 05/08/2023   TSH 7.120 (H) 01/03/2023   TSH 19.050 (H) 09/29/2022   TSH 42.600 (H) 06/24/2022   TSH <0.005 (L) 03/03/2022   TSH <0.005 (L) 12/30/2021   TSH <0.005 (L) 10/27/2021   TSH <0.005 (L) 08/25/2021   FREET4 0.90 12/14/2023   FREET4 1.85 (H) 08/17/2023   FREET4 1.81 (H) 05/08/2023   FREET4 1.18 01/03/2023   FREET4 0.72 09/29/2022   FREET4 0.10 (L) 06/24/2022   FREET4 1.62 03/03/2022   FREET4 2.10 (H) 12/30/2021   FREET4 2.12 (H) 10/27/2021   FREET4 1.66 08/25/2021       Uptake and Scan from 04/29/21 CLINICAL DATA:  Hyperthyroidism. TSH equal 0.005. Nervousness. And increased appetite. Difficulty sleeping. Hair loss. Heart palpitations.   EXAM: THYROID  SCAN AND UPTAKE - 4 AND 24 HOURS   TECHNIQUE: Following oral administration of I-123 capsule, anterior planar imaging was acquired at 24 hours. Thyroid  uptake was calculated with a thyroid  probe at 4-6 hours and 24 hours.   RADIOPHARMACEUTICALS:  Three hundred uCi I-123 sodium iodide p.o.   COMPARISON:  None.   FINDINGS: Uniform uptake within thyroid  gland.  No nodularity.   4 hour I-123 uptake = 71.8% (normal 5-20%)   24 hour I-123 uptake = 59.4% (normal 10-30%)   IMPRESSION: 1. Thyroid  imaging and I 123 uptake consistent with Graves disease. 2. Rapid turnover of I 123 at 4 hour uptake.     Electronically Signed   By: Jackquline Boxer M.D.   On: 04/29/2021 16:05   Latest Reference Range & Units 01/03/23 15:06 05/08/23 12:10 08/17/23 14:13 12/14/23 08:48  TSH 0.450 - 4.500 uIU/mL 7.120 (H) 0.191 (L) 0.047 (L) 7.440 (H)  T4,Free(Direct) 0.82 - 1.77 ng/dL 8.81 8.18 (H) 8.14 (H) 0.90  (H): Data is abnormally high (L): Data is abnormally low Assessment & Plan:   1) Hypothyroidism- S/p RAI ablation for Graves Disease  she is being seen at a kind request of Tobie Suzzane POUR, MD.  She is S/P RAI ablation on 05/07/21 and once again on 04/08/22.   Her  previsit  TFTs are consistent with under-replacement but she notes she has only been taking Levothyroxine  25 mcg which is what she got from the pharmacy last refill.  She is advised to restart Levothyroxine  88 mcg po daily before breakfast.  Will recheck TFTs in 8 weeks adjust dose accordingly.  Will call patient with results and next steps.   - The correct intake of thyroid  hormone (Levothyroxine , Synthroid ), is on empty stomach first thing in the morning, with water, separated by at least 30 minutes from breakfast and other medications,  and separated by more than 4 hours from calcium, iron, multivitamins, acid reflux medications (PPIs).  - This medication is a life-long medication and will be needed to correct thyroid  hormone imbalances for the rest of your life.  The dose may change from time to time, based on thyroid  blood work.  - It is extremely important to be consistent taking this medication, near the same time each morning.  -AVOID TAKING PRODUCTS CONTAINING BIOTIN (commonly found in Hair, Skin, Nails vitamins) AS IT INTERFERES WITH THE VALIDITY OF THYROID  FUNCTION BLOOD TESTS.       -Patient is advised to maintain close follow up with Tobie Suzzane POUR, MD for primary care needs.    I spent  27  minutes in the care of the patient today including review of labs from Thyroid  Function, CMP, and other relevant labs ; imaging/biopsy records (current and previous including abstractions from other facilities); face-to-face time discussing  her lab results and symptoms, medications doses, her options of short and long term treatment based on the latest standards of care / guidelines;   and documenting the encounter.  Krista Ramirez  participated in the discussions, expressed understanding, and voiced agreement with the above plans.  All questions were answered to her satisfaction. she is encouraged to contact clinic should she have any questions or concerns prior to her return visit.  Follow  up plan: Return labs in 8 weeks, will call with results and next steps.   Thank you for involving me in the care of this pleasant patient, and I will continue to update you with her progress.   Benton Rio, [facility] [address] [place], [place] [address] Phone: [phone] Fax: [phone]  12/20/2023, 1:41 PM

## 2024-01-04 ENCOUNTER — Ambulatory Visit: Admitting: Internal Medicine

## 2024-02-20 LAB — T4, FREE: Free T4: 1.59 ng/dL (ref 0.82–1.77)

## 2024-02-20 LAB — TSH: TSH: 1.24 u[IU]/mL (ref 0.450–4.500)

## 2024-02-21 ENCOUNTER — Ambulatory Visit: Payer: Self-pay | Admitting: Nurse Practitioner

## 2024-02-21 DIAGNOSIS — E890 Postprocedural hypothyroidism: Secondary | ICD-10-CM

## 2024-02-21 MED ORDER — LEVOTHYROXINE SODIUM 88 MCG PO TABS: 88.0000 ug | ORAL_TABLET | Freq: Every day | ORAL | 1 refills | Status: AC

## 2024-02-21 NOTE — Progress Notes (Signed)
 Please schedule follow up in 4 months with previsit labs.

## 2024-06-25 ENCOUNTER — Ambulatory Visit: Admitting: Nurse Practitioner
